# Patient Record
Sex: Male | Born: 1965 | Race: White | Hispanic: No | Marital: Single | Smoking: Never smoker
Health system: Southern US, Community
[De-identification: ages and names within clinical notes are randomized; demographics above are authoritative.]

## PROBLEM LIST (undated history)

## (undated) DIAGNOSIS — E7800 Pure hypercholesterolemia, unspecified: Secondary | ICD-10-CM

## (undated) DIAGNOSIS — K219 Gastro-esophageal reflux disease without esophagitis: Secondary | ICD-10-CM

## (undated) DIAGNOSIS — F79 Unspecified intellectual disabilities: Secondary | ICD-10-CM

## (undated) DIAGNOSIS — N200 Calculus of kidney: Secondary | ICD-10-CM

## (undated) HISTORY — DX: Pure hypercholesterolemia, unspecified: E78.00

## (undated) HISTORY — DX: Calculus of kidney: N20.0

## (undated) HISTORY — DX: Gastro-esophageal reflux disease without esophagitis: K21.9

---

## 2008-06-25 ENCOUNTER — Emergency Department (HOSPITAL_COMMUNITY): Admission: EM | Admit: 2008-06-25 | Discharge: 2008-06-25 | Payer: Self-pay | Admitting: Emergency Medicine

## 2020-09-20 ENCOUNTER — Other Ambulatory Visit: Payer: Self-pay | Admitting: Family Medicine

## 2020-09-20 ENCOUNTER — Other Ambulatory Visit (HOSPITAL_COMMUNITY): Payer: Self-pay | Admitting: Family Medicine

## 2020-09-20 DIAGNOSIS — R1314 Dysphagia, pharyngoesophageal phase: Secondary | ICD-10-CM

## 2020-09-23 ENCOUNTER — Other Ambulatory Visit: Payer: Self-pay

## 2020-09-23 ENCOUNTER — Ambulatory Visit (HOSPITAL_COMMUNITY)
Admission: RE | Admit: 2020-09-23 | Discharge: 2020-09-23 | Disposition: A | Discharge status: Home / Self Care | Payer: Medicare Other | Source: Ambulatory Visit | Attending: Family Medicine | Admitting: Family Medicine

## 2020-09-23 ENCOUNTER — Other Ambulatory Visit (HOSPITAL_COMMUNITY): Payer: Self-pay | Admitting: Family Medicine

## 2020-09-23 DIAGNOSIS — R1314 Dysphagia, pharyngoesophageal phase: Secondary | ICD-10-CM | POA: Diagnosis present

## 2021-08-29 IMAGING — RF DG ESOPHAGUS
9 of 11 series · 12 of 24 positions shown · non-contrast
Comparison: None

CLINICAL DATA: Dysphagia, pharyngo esophageal phase, difficulty
swallowing meats and breads, which hang in throat choking patient,
symptoms for a few weeks, no prior endoscopy

EXAM:
ESOPHOGRAM / BARIUM SWALLOW / BARIUM TABLET STUDY
TECHNIQUE: Combined double contrast and single contrast examination performed
using effervescent crystals, thick barium liquid, and thin barium
liquid. The patient was observed with fluoroscopy swallowing a 13 mm
barium sulphate tablet.
FLUOROSCOPY TIME:  Fluoroscopy Time:  1 minutes 42 seconds
Radiation Exposure Index (if provided by the fluoroscopic device):
45.9 mGy
Number of Acquired Spot Images: multiple fluoroscopic screen
captures

[Series 1: cp_standard · 0.17mm/px · 1 of 61 frames shown (1 of 9)]
[frame 20/61]
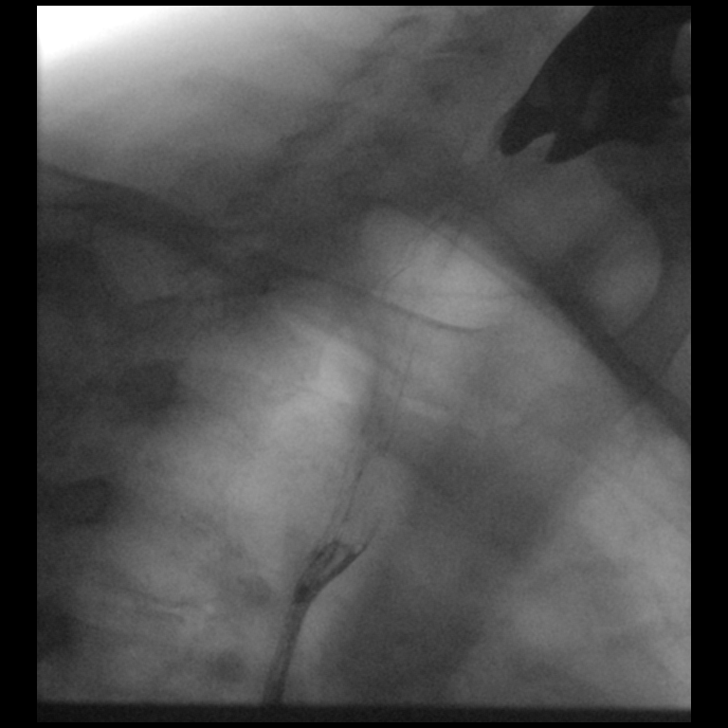

[Series 2: cp_standard · 0.17mm/px · 2 of 52 frames shown (2 of 9)]
[frame 8/52]
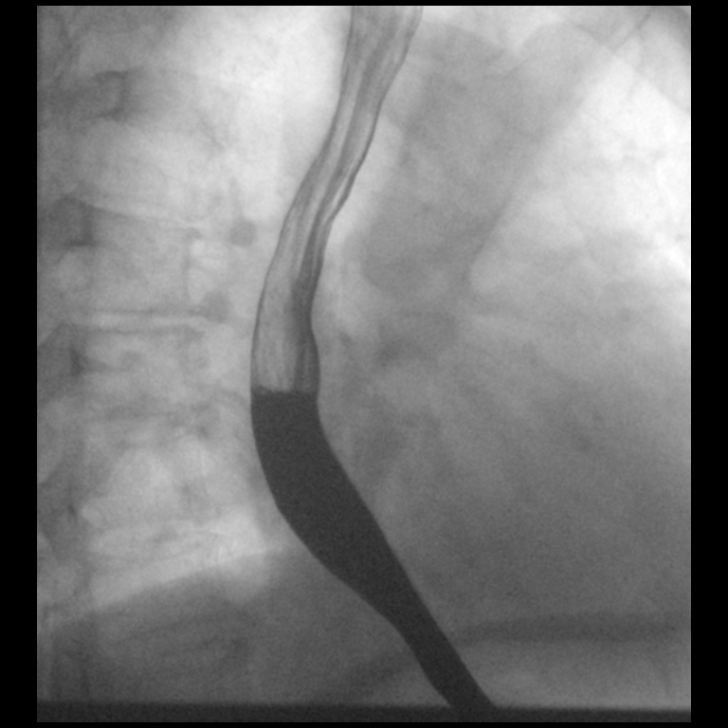
[frame 50/52]
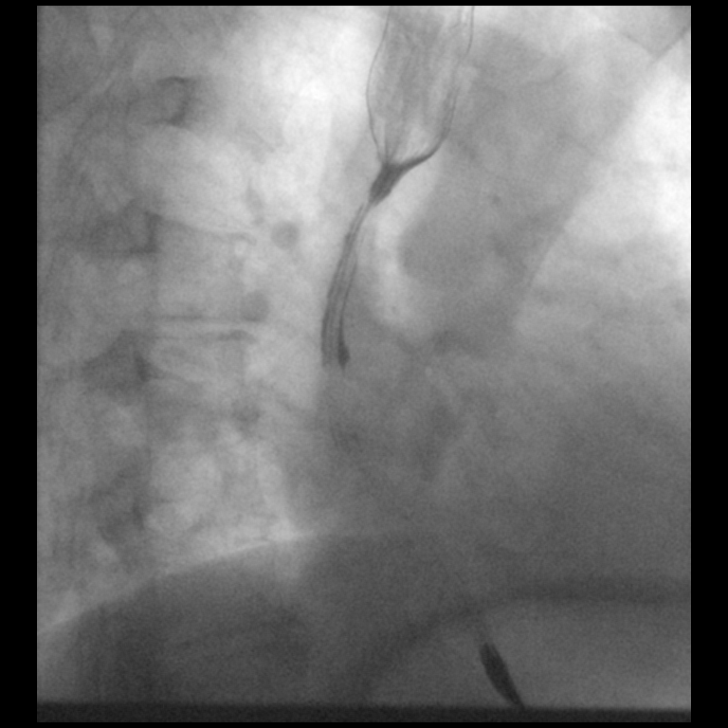

[Series 3: cp_standard · 0.17mm/px · 1 of 39 frames shown (3 of 9)]
[frame 20/39]
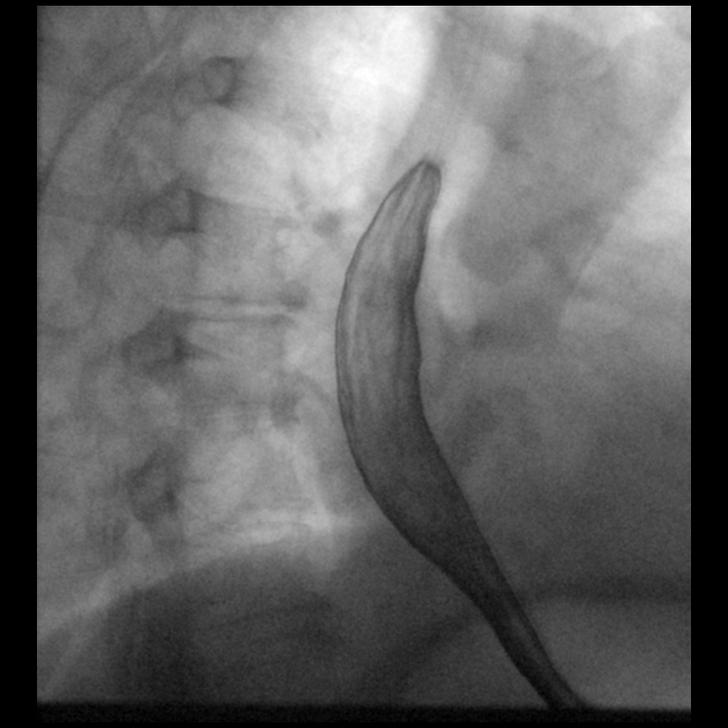

[Series 4: cp_standard · 0.17mm/px · 1 of 78 frames shown (4 of 9)]
[frame 40/78]
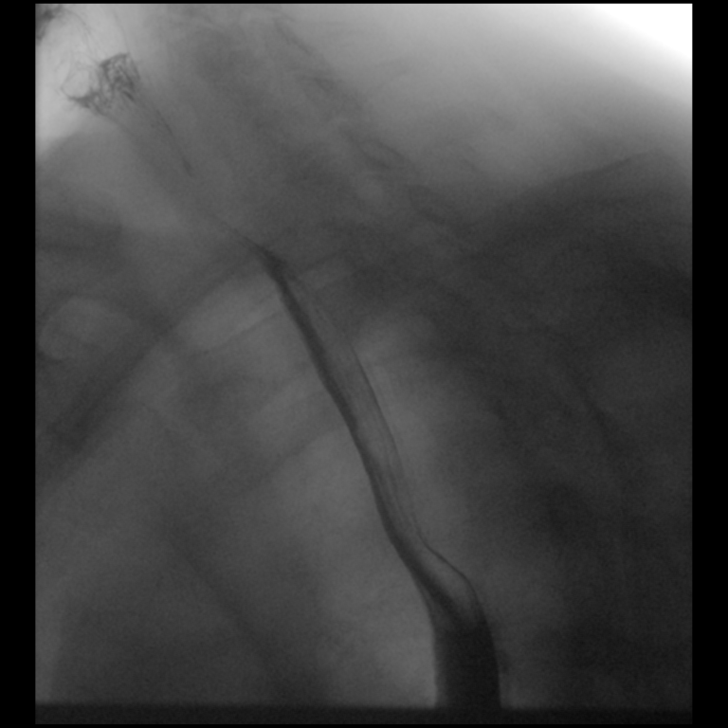

[Series 5: cp_standard · 0.17mm/px · 2 of 168 frames shown (5 of 9)]
[frame 22/168]
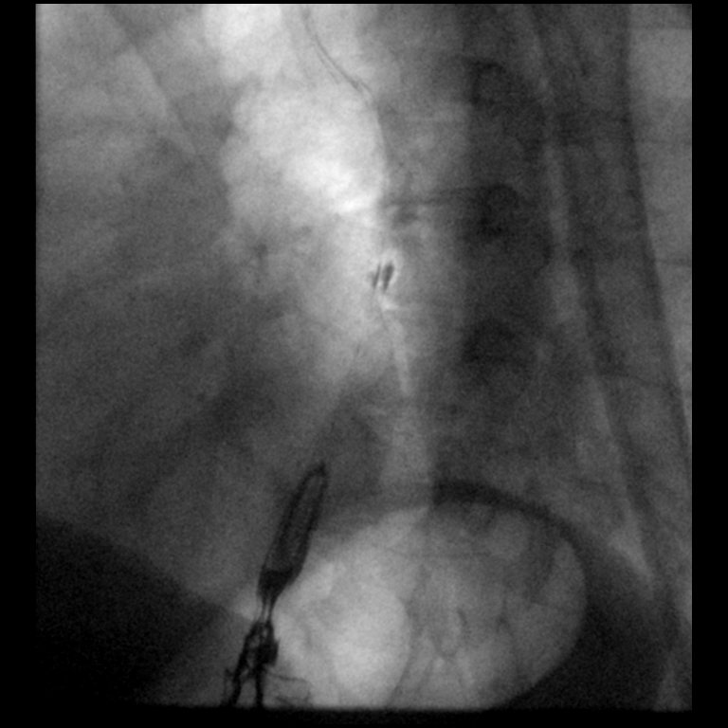
[frame 143/168]
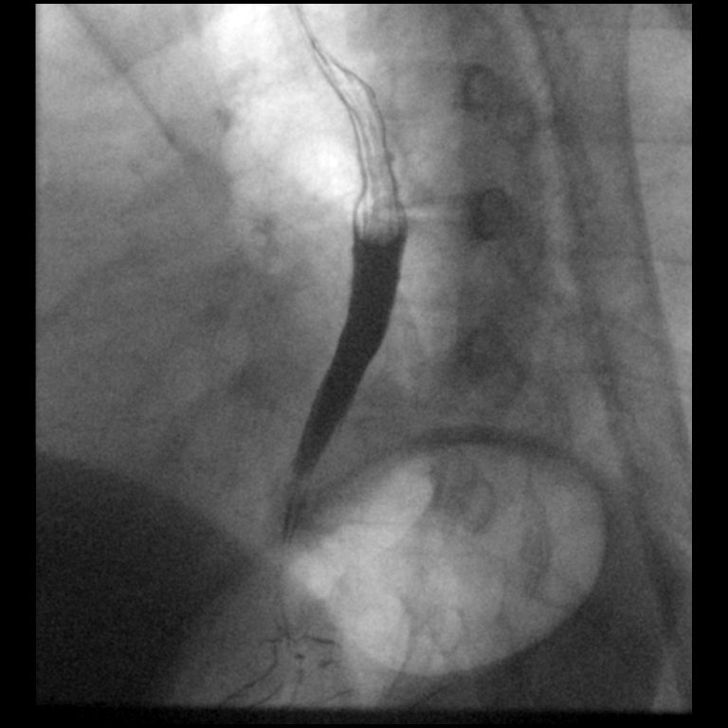

[Series 6: cp_standard · 0.27mm/px · 1 of 111 frames shown (6 of 9)]
[frame 56/111]
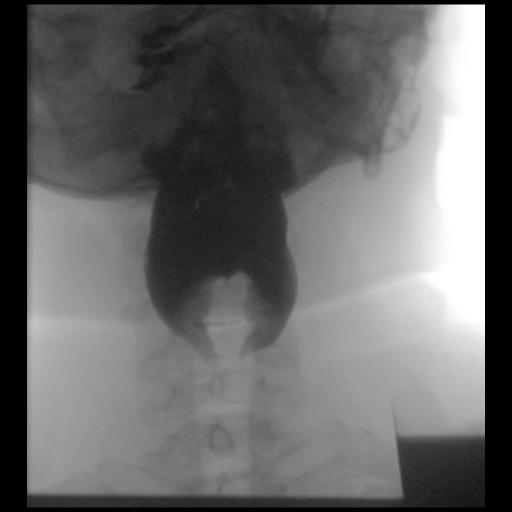

[Series 8: cp_standard · 0.25mm/px · 2 of 93 frames shown (7 of 9)]
[frame 12/93]
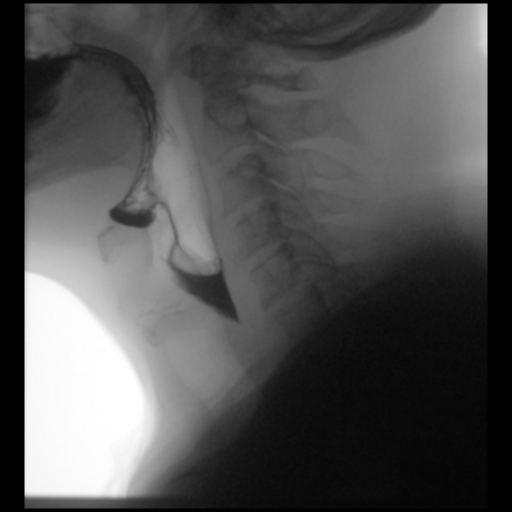
[frame 80/93]
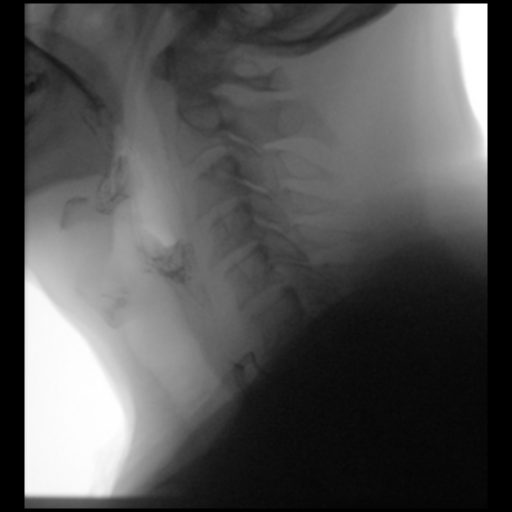

[Series 9: cp_standard · 0.25mm/px · 1 of 184 frames shown (8 of 9)]
[frame 157/184]
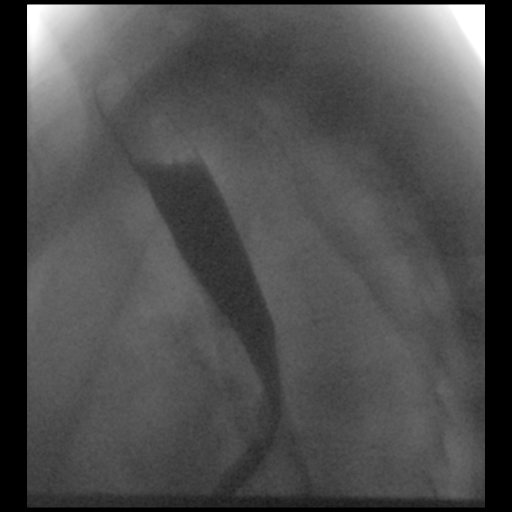

[Series 11: cp_standard · 0.18mm/px · 1 of 1 slices shown (9 of 9)]
[im 1/1]
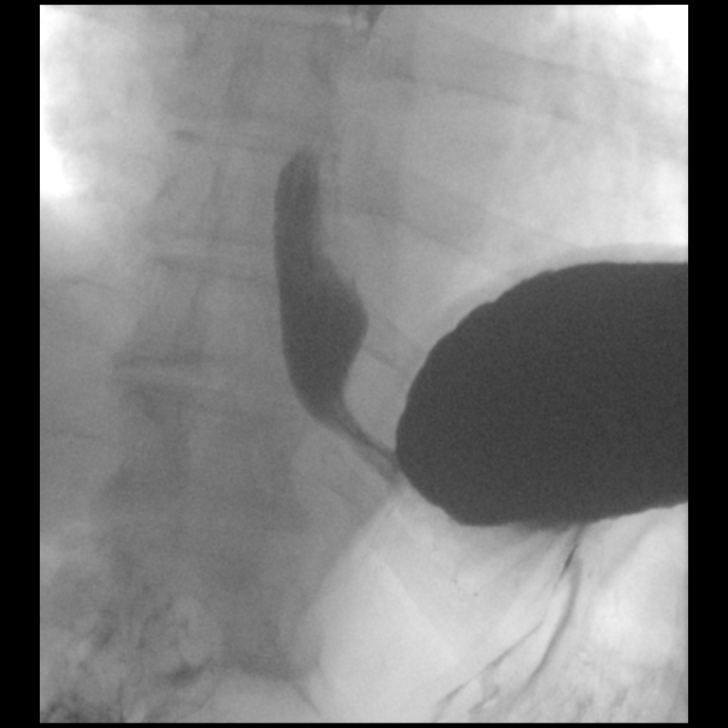

[12 of 24 positions shown; findings below may reference images not displayed]

FINDINGS: Esophageal distention: Normal distention without mass or stricture

Filling defects:  None

12.5 mm barium tablet: Easily passed from oral cavity to stomach
without obstruction

Motility:  Normal

Mucosa:  Smooth without irregularity or ulceration

Hypopharynx/cervical esophagus: Laryngeal penetration with contrast
extending to the vocal cords. No definite aspiration into the
proximal trachea. Significant vallecular and piriform sinus
residuals. No Zenker diverticulum.

Hiatal hernia:  Absent

GE reflux:  Not witnessed during exam

Other:  N/A
IMPRESSION: Laryngeal penetration with contrast extending to the vocal cords
without a aspiration into proximal trachea.

Significant vallecular and piriform sinus residuals.

No esophageal mass or stricture.

## 2021-12-28 ENCOUNTER — Encounter (HOSPITAL_COMMUNITY)
Admission: RE | Admit: 2021-12-28 | Discharge: 2021-12-28 | Disposition: A | Discharge status: Home / Self Care | Payer: Medicare Other | Source: Ambulatory Visit | Attending: Ophthalmology | Admitting: Ophthalmology

## 2022-01-01 NOTE — H&P (Signed)
Surgical History & Physical ? ?Patient Name: Nathaniel Huff DOB: December 01, 1965 ? ?Surgery: Cataract extraction with intraocular lens implant phacoemulsification; Left Eye ? ?Surgeon: Fabio Pierce MD ?Surgery Date:  01-05-22 ?Pre-Op Date:  12-28-21 ? ?HPI: ?A 69 Yr. old male patient The patient is here for a cataract evaluation of both eyes, referred from Dr. Daphine Deutscher. Since the last visit, the affected area is worsening. The patient's vision is blurry. The complaint is associated with difficulty reading small print on medicine bottles/labels, difficulty reading captions on a tv, and difficulty recognizing people at a distance. Patient is not taking medications. This is negatively affecting the patient's quality of life and the patient is unable to function adequately in life with the current level of vision. HPI Completed by Dr. Fabio Pierce ? ?Medical History: ?Dry Eyes ?Trichiasis of right eyelid ?Macula Degeneration ?Glaucoma ?Gastroesophageal reflux disease Hypercholesterol... ? ?Review of Systems ?Negative Allergic/Immunologic ?Negative Cardiovascular ?Negative Constitutional ?Negative Ear, Nose, Mouth & Throat ?Negative Endocrine ?Negative Eyes ?Negative Gastrointestinal ?Negative Genitourinary ?Negative Hemotologic/Lymphatic ?Negative Integumentary ?Negative Musculoskeletal ?Negative Neurological ?Negative Psychiatry ?Negative Respiratory ? ?Social ?  Former smoker  ? ?Medication ?Atorvastatin, Omeprazole,  ? ?Sx/Procedures ? None ? ?Drug Allergies  ? NKDA ? ?History & Physical: ?Heent: Cataract, Left Eye ?NECK: supple without bruits ?LUNGS: lungs clear to auscultation ?CV: regular rate and rhythm ?Abdomen: soft and non-tender ?Impression & Plan: ?Assessment: ?1.  COMBINED FORMS AGE RELATED CATARACT; Left Eye [phone]) ?2.  Trichiasis; Left Lower Lid (H02.015) ?3.  NUCLEAR SCLEROSIS AGE RELATED; Right Eye (H25.11) ?4.  ASTIGMATISM, REGULAR; Both Eyes (H52.223) ? ?Plan: 1.  Cataract accounts for the patient's  decreased vision. This visual impairment is not correctable with a tolerable change in glasses or contact lenses. Cataract surgery with an implantation of a new lens should significantly improve the visual and functional status of the patient. Discussed all risks, benefits, alternatives, and potential complications. Discussed the procedures and recovery. Patient desires to have surgery. A-scan ordered and performed today for intra-ocular lens calculations. The surgery will be performed in order to improve vision for driving, reading, and for eye examinations. Recommend phacoemulsification with intra-ocular lens. Recommend Dextenza for post-operative pain and inflammation. ?Left Eye worse - first. ?Dilates well - shugarcaine by protocol. ?Toric Lens. ? ?2.  Asymptomatic, observe. ? ?3.  Will address after left eye. ? ?4.  recommend toric IOL OU. ?

## 2022-01-01 NOTE — Pre-Procedure Instructions (Signed)
Attempted for the second time to call patient for preop phone call. The phone states "call cannot be completed at this time. Please call back later.". ?

## 2022-01-03 ENCOUNTER — Encounter (HOSPITAL_COMMUNITY): Payer: Self-pay | Admitting: Ophthalmology

## 2022-01-05 ENCOUNTER — Encounter (HOSPITAL_COMMUNITY)
Admission: RE | Disposition: A | Discharge status: Home / Self Care | Payer: Self-pay | Source: Ambulatory Visit | Attending: Ophthalmology

## 2022-01-05 ENCOUNTER — Ambulatory Visit (HOSPITAL_BASED_OUTPATIENT_CLINIC_OR_DEPARTMENT_OTHER): Payer: Medicare Other | Admitting: Anesthesiology

## 2022-01-05 ENCOUNTER — Ambulatory Visit (HOSPITAL_COMMUNITY): Payer: Medicare Other | Admitting: Anesthesiology

## 2022-01-05 ENCOUNTER — Other Ambulatory Visit: Payer: Self-pay

## 2022-01-05 ENCOUNTER — Ambulatory Visit (HOSPITAL_COMMUNITY)
Admission: RE | Admit: 2022-01-05 | Discharge: 2022-01-05 | Disposition: A | Discharge status: Home / Self Care | Payer: Medicare Other | Source: Ambulatory Visit | Attending: Ophthalmology | Admitting: Ophthalmology

## 2022-01-05 ENCOUNTER — Encounter (HOSPITAL_COMMUNITY): Payer: Self-pay | Admitting: Ophthalmology

## 2022-01-05 DIAGNOSIS — H52223 Regular astigmatism, bilateral: Secondary | ICD-10-CM | POA: Diagnosis not present

## 2022-01-05 DIAGNOSIS — H02055 Trichiasis without entropian left lower eyelid: Secondary | ICD-10-CM | POA: Insufficient documentation

## 2022-01-05 DIAGNOSIS — H25812 Combined forms of age-related cataract, left eye: Secondary | ICD-10-CM | POA: Diagnosis present

## 2022-01-05 DIAGNOSIS — H2511 Age-related nuclear cataract, right eye: Secondary | ICD-10-CM | POA: Diagnosis not present

## 2022-01-05 HISTORY — DX: Unspecified intellectual disabilities: F79

## 2022-01-05 HISTORY — PX: CATARACT EXTRACTION W/PHACO: SHX586

## 2022-01-05 SURGERY — PHACOEMULSIFICATION, CATARACT, WITH IOL INSERTION
Anesthesia: Monitor Anesthesia Care | Site: Eye | Laterality: Left

## 2022-01-05 MED ORDER — LIDOCAINE HCL (PF) 1 % IJ SOLN
INTRAOCULAR | Status: DC | PRN
  Administered 2022-01-05: 1 mL

## 2022-01-05 MED ORDER — STERILE WATER FOR IRRIGATION IR SOLN
Status: DC | PRN
  Administered 2022-01-05: 250 mL

## 2022-01-05 MED ORDER — SODIUM HYALURONATE 10 MG/ML IO SOLUTION
PREFILLED_SYRINGE | INTRAOCULAR | Status: DC | PRN
  Administered 2022-01-05: 0.85 mL via INTRAOCULAR

## 2022-01-05 MED ORDER — PHENYLEPHRINE HCL 2.5 % OP SOLN
1.0000 [drp] | OPHTHALMIC | Status: AC | PRN
  Administered 2022-01-05 (×3): 1 [drp] via OPHTHALMIC

## 2022-01-05 MED ORDER — EPINEPHRINE PF 1 MG/ML IJ SOLN
INTRAMUSCULAR | Status: AC
  Filled 2022-01-05: qty 2

## 2022-01-05 MED ORDER — LIDOCAINE HCL 3.5 % OP GEL
1.0000 "application " | Freq: Once | OPHTHALMIC | Status: AC
  Administered 2022-01-05: 1 via OPHTHALMIC

## 2022-01-05 MED ORDER — PHENYLEPHRINE-KETOROLAC 1-0.3 % IO SOLN
INTRAOCULAR | Status: AC
  Filled 2022-01-05: qty 4

## 2022-01-05 MED ORDER — SODIUM HYALURONATE 23MG/ML IO SOSY
PREFILLED_SYRINGE | INTRAOCULAR | Status: DC | PRN
  Administered 2022-01-05: 0.6 mL via INTRAOCULAR

## 2022-01-05 MED ORDER — TROPICAMIDE 1 % OP SOLN
1.0000 [drp] | OPHTHALMIC | Status: AC | PRN
  Administered 2022-01-05 (×3): 1 [drp] via OPHTHALMIC

## 2022-01-05 MED ORDER — POVIDONE-IODINE 5 % OP SOLN
OPHTHALMIC | Status: DC | PRN
Start: 2022-01-05 — End: 2022-01-05
  Administered 2022-01-05: 1 via OPHTHALMIC

## 2022-01-05 MED ORDER — TETRACAINE HCL 0.5 % OP SOLN
1.0000 [drp] | OPHTHALMIC | Status: AC | PRN
  Administered 2022-01-05 (×3): 1 [drp] via OPHTHALMIC

## 2022-01-05 MED ORDER — EPINEPHRINE PF 1 MG/ML IJ SOLN
INTRAOCULAR | Status: DC | PRN
  Administered 2022-01-05: 500 mL

## 2022-01-05 MED ORDER — NEOMYCIN-POLYMYXIN-DEXAMETH 3.5-10000-0.1 OP SUSP
OPHTHALMIC | Status: DC | PRN
  Administered 2022-01-05: 2 [drp] via OPHTHALMIC

## 2022-01-05 MED ORDER — BSS IO SOLN
INTRAOCULAR | Status: DC | PRN
  Administered 2022-01-05: 15 mL via INTRAOCULAR

## 2022-01-05 SURGICAL SUPPLY — 14 items
CATARACT SUITE SIGHTPATH (MISCELLANEOUS) ×2 IMPLANT
CLOTH BEACON ORANGE TIMEOUT ST (SAFETY) ×2 IMPLANT
EYE SHIELD UNIVERSAL CLEAR (GAUZE/BANDAGES/DRESSINGS) ×1 IMPLANT
FEE CATARACT SUITE SIGHTPATH (MISCELLANEOUS) ×1 IMPLANT
GLOVE SURG UNDER POLY LF SZ7 (GLOVE) ×2 IMPLANT
LENS IOL RAYNER 21.0 (Intraocular Lens) ×2 IMPLANT
LENS IOL RAYONE EMV 21.0 (Intraocular Lens) IMPLANT
NDL HYPO 18GX1.5 BLUNT FILL (NEEDLE) ×1 IMPLANT
NEEDLE HYPO 18GX1.5 BLUNT FILL (NEEDLE) ×2 IMPLANT
PAD ARMBOARD 7.5X6 YLW CONV (MISCELLANEOUS) ×2 IMPLANT
RING MALYGIN 7.0 (MISCELLANEOUS) ×1 IMPLANT
SYR TB 1ML LL NO SAFETY (SYRINGE) ×2 IMPLANT
TAPE PAPER 1X10 WHT MICROPORE (GAUZE/BANDAGES/DRESSINGS) ×1 IMPLANT
WATER STERILE IRR 250ML POUR (IV SOLUTION) ×2 IMPLANT

## 2022-01-05 NOTE — Transfer of Care (Signed)
Immediate Anesthesia Transfer of Care Note ? ?Patient: BRAYCEN BURANDT ? ?Procedure(s) Performed: CATARACT EXTRACTION PHACO AND INTRAOCULAR LENS PLACEMENT (IOC) (Left: Eye) ? ?Patient Location: Short Stay ? ?Anesthesia Type:MAC ? ?Level of Consciousness: awake and patient cooperative ? ?Airway & Oxygen Therapy: Patient Spontanous Breathing ? ?Post-op Assessment: Report given to RN and Post -op Vital signs reviewed and stable ? ?Post vital signs: Reviewed and stable ? ?Last Vitals:  ?Vitals Value Taken Time  ?BP 162/95 1155  ?Temp 98.1 1155  ?Pulse 67 1155  ?Resp 20 1155  ?SpO2 98 1155  ? ? ?Last Pain:  ?Vitals:  ? 01/05/22 1037  ?TempSrc: Oral  ?PainSc:   ?   ? ?Patients Stated Pain Goal: 5 (01/05/22 1047) ? ?Complications: No notable events documented. ?

## 2022-01-05 NOTE — Op Note (Signed)
Date of procedure: 01/05/22 ? ?Pre-operative diagnosis: Visually significant age-related combined cataract, Left Eye (H25.812) ? ?Post-operative diagnosis: Visually significant age-related combined cataract, Left Eye (H25.812) ? ?Procedure: Removal of cataract via phacoemulsification and insertion of intra-ocular lens Rayner RAO200E +21.0D into the capsular bag of the Left Eye ? ?Attending surgeon: Gerda Diss. Marisa Hua, MD, MA ? ?Anesthesia: MAC, Topical Akten ? ?Complications: None ? ?Estimated Blood Loss: <38m (minimal) ? ?Specimens: None ? ?Implants: As above ? ?Indications:  Visually significant age-related cataract, Left Eye ? ?Procedure:  ?The patient was seen and identified in the pre-operative area. The operative eye was identified and dilated.  The operative eye was marked.  Topical anesthesia was administered to the operative eye.    ? ?The patient was then to the operative suite and placed in the supine position.  A timeout was performed confirming the patient, procedure to be performed, and all other relevant information.   The patient's face was prepped and draped in the usual fashion for intra-ocular surgery.  A lid speculum was placed into the operative eye and the surgical microscope moved into place and focused.  An inferotemporal paracentesis was created using a 20 gauge paracentesis blade.  Shugarcaine was injected into the anterior chamber.  Viscoelastic was injected into the anterior chamber.  A temporal clear-corneal main wound incision was created using a 2.480mmicrokeratome.  A continuous curvilinear capsulorrhexis was initiated using an irrigating cystitome and completed using capsulorrhexis forceps.  Hydrodissection and hydrodeliniation were performed.  Viscoelastic was injected into the anterior chamber.  A phacoemulsification handpiece and a chopper as a second instrument were used to remove the nucleus and epinucleus. The irrigation/aspiration handpiece was used to remove any remaining  cortical material.  ? ?The capsular bag was reinflated with viscoelastic, checked, and found to be intact.  The intraocular lens was inserted into the capsular bag.  The irrigation/aspiration handpiece was used to remove any remaining viscoelastic.  The clear corneal wound and paracentesis wounds were then hydrated and checked with Weck-Cels to be watertight.  Maxitrol was instilled in the eye. The lid-speculum was removed.  The drape was removed.  The patient's face was cleaned with a wet and dry 4x4.    A clear shield was taped over the eye. The patient was taken to the post-operative care unit in good condition, having tolerated the procedure well. ? ?Post-Op Instructions: [facility] and will receive all other orders and instructions. ? ?

## 2022-01-05 NOTE — Interval H&P Note (Signed)
History and Physical Interval Note: ? ?01/05/2022 ?11:28 AM ? ?Nathaniel Huff  has presented today for surgery, with the diagnosis of combined forms age related cataract, left.  The various methods of treatment have been discussed with the patient and family. After consideration of risks, benefits and other options for treatment, the patient has consented to  Procedure(s) with comments: ?CATARACT EXTRACTION PHACO AND INTRAOCULAR LENS PLACEMENT (IOC) (Left) - ODE:  as a surgical intervention.  The patient's history has been reviewed, patient examined, no change in status, stable for surgery.  I have reviewed the patient's chart and labs.  Questions were answered to the patient's satisfaction.   ? ? ?Fabio Pierce ? ? ?

## 2022-01-05 NOTE — Discharge Instructions (Addendum)
Please discharge patient when stable, will follow up today with Dr. Wrzosek at the [facility] [place] office immediately following discharge.  Leave shield in place until visit.  All paperwork with discharge instructions will be given at the office.  [facility] [place] Address:  [address]  [place], [place] [address]  

## 2022-01-05 NOTE — Anesthesia Preprocedure Evaluation (Signed)
Anesthesia Evaluation  ?Patient identified by MRN, date of birth, ID band ?Patient awake ? ? ? ?Reviewed: ?Allergy & Precautions, H&P , NPO status , Patient's Chart, lab work & pertinent test results, reviewed documented beta blocker date and time  ? ?Airway ?Mallampati: II ? ?TM Distance: >3 FB ?Neck ROM: full ? ? ? Dental ?no notable dental hx. ? ?  ?Pulmonary ?neg pulmonary ROS,  ?  ?Pulmonary exam normal ?breath sounds clear to auscultation ? ? ? ? ? ? Cardiovascular ?Exercise Tolerance: Good ?negative cardio ROS ? ? ?Rhythm:regular Rate:Normal ? ? ?  ?Neuro/Psych ?negative neurological ROS ? negative psych ROS  ? GI/Hepatic ?negative GI ROS, Neg liver ROS,   ?Endo/Other  ?negative endocrine ROS ? Renal/GU ?negative Renal ROS  ?negative genitourinary ?  ?Musculoskeletal ? ? Abdominal ?  ?Peds ? Hematology ?negative hematology ROS ?(+)   ?Anesthesia Other Findings ? ? Reproductive/Obstetrics ?negative OB ROS ? ?  ? ? ? ? ? ? ? ? ? ? ? ? ? ?  ?  ? ? ? ? ? ? ? ? ?Anesthesia Physical ?Anesthesia Plan ? ?ASA: 2 ? ?Anesthesia Plan: MAC  ? ?Post-op Pain Management:   ? ?Induction:  ? ?PONV Risk Score and Plan:  ? ?Airway Management Planned:  ? ?Additional Equipment:  ? ?Intra-op Plan:  ? ?Post-operative Plan:  ? ?Informed Consent: I have reviewed the patients History and Physical, chart, labs and discussed the procedure including the risks, benefits and alternatives for the proposed anesthesia with the patient or authorized representative who has indicated his/her understanding and acceptance.  ? ? ? ?Dental Advisory Given ? ?Plan Discussed with: CRNA ? ?Anesthesia Plan Comments:   ? ? ? ? ? ? ?Anesthesia Quick Evaluation ? ?

## 2022-01-05 NOTE — Anesthesia Postprocedure Evaluation (Signed)
Anesthesia Post Note ? ?Patient: Nathaniel Huff ? ?Procedure(s) Performed: CATARACT EXTRACTION PHACO AND INTRAOCULAR LENS PLACEMENT (IOC) (Left: Eye) ? ?Patient location during evaluation: Phase II ?Anesthesia Type: MAC ?Level of consciousness: awake ?Pain management: pain level controlled ?Vital Signs Assessment: post-procedure vital signs reviewed and stable ?Respiratory status: spontaneous breathing and respiratory function stable ?Cardiovascular status: blood pressure returned to baseline and stable ?Postop Assessment: no headache and no apparent nausea or vomiting ?Anesthetic complications: no ?Comments: Late entry ? ? ?No notable events documented. ? ? ?Last Vitals:  ?Vitals:  ? 01/05/22 1037 01/05/22 1155  ?BP: (!) 139/95 (!) 162/95  ?Pulse: 66 63  ?Resp: 17 18  ?Temp: 36.5 ?C 36.7 ?C  ?SpO2: 99% 96%  ?  ?Last Pain:  ?Vitals:  ? 01/05/22 1155  ?TempSrc: Oral  ?PainSc: 0-No pain  ? ? ?  ?  ?  ?  ?  ?  ? ?Louann Sjogren ? ? ? ? ?

## 2022-01-05 NOTE — Anesthesia Procedure Notes (Signed)
Date/Time: 01/05/2022 11:34 AM ?Performed by: Franco Nones, CRNA ?Pre-anesthesia Checklist: Patient identified, Emergency Drugs available, Suction available, Timeout performed and Patient being monitored ?Patient Re-evaluated:Patient Re-evaluated prior to induction ?Oxygen Delivery Method: Nasal Cannula ? ? ? ? ?

## 2022-01-08 ENCOUNTER — Encounter (HOSPITAL_COMMUNITY): Payer: Self-pay | Admitting: Ophthalmology

## 2022-01-10 ENCOUNTER — Encounter (HOSPITAL_COMMUNITY)
Admission: RE | Admit: 2022-01-10 | Discharge: 2022-01-10 | Disposition: A | Discharge status: Home / Self Care | Payer: Medicare Other | Source: Ambulatory Visit | Attending: Ophthalmology | Admitting: Ophthalmology

## 2022-01-10 NOTE — H&P (Signed)
Surgical History & Physical ? ?Patient Name: Nathaniel Huff DOB: Apr 24, 1966 ? ?Surgery: Cataract extraction with intraocular lens implant phacoemulsification; Right Eye ? ?Surgeon: Baruch Goldmann MD ?Surgery Date:  01-15-22 ?Pre-Op Date:  01-08-22 ? ?HPI: ?A 70 Yr. old male patient 1. The patient is returning after cataract post-op. The left eye is affected. Status post cataract post-op, 1 week ago: Since the last visit, the affected area is doing well. The patient's vision is improved. Patient is following medication instructions. The patient's right vision is blurry. The complaint is associated with difficulty reading small print on medicine bottles/labels, difficulty reading captions on a tv, and difficulty recognizing people at a distance. This is negatively affecting the patient's quality of life and the patient is unable to function adequately in life with the current level of vision. HPI was performed by Baruch Goldmann . ? ?Medical History: ?Dry Eyes ?Trichiasis of right eyelid ?Macula Degeneration ?Glaucoma ?Gastroesophageal reflux disease Hypercholesterol... ? ?Review of Systems ?Negative Allergic/Immunologic ?Negative Cardiovascular ?Negative Constitutional ?Negative Ear, Nose, Mouth & Throat ?Negative Endocrine ?Negative Eyes ?Negative Gastrointestinal ?Negative Genitourinary ?Negative Hemotologic/Lymphatic ?Negative Integumentary ?Negative Musculoskeletal ?Negative Neurological ?Negative Psychiatry ?Negative Respiratory ? ?Social ?  Former smoker  ? ?Medication ?Prednisolone-Moxifloxacin-Bromfenac,  ?Atorvastatin, Omeprazole,  ? ?Sx/Procedures ?Phaco c IOL OS,  ? ?Drug Allergies  ? NKDA ? ?History & Physical: ?Heent: Cataract, Right Eye ?NECK: supple without bruits ?LUNGS: lungs clear to auscultation ?CV: regular rate and rhythm ?Abdomen: soft and non-tender ? ?Impression & Plan: ?Assessment: ?1.  CATARACT EXTRACTION STATUS; Left Eye [phone]) ?2.  NUCLEAR SCLEROSIS AGE RELATED; Right Eye (H25.11) ? ?Plan: 1.   1 week after cataract surgery. Doing well with improved vision and normal eye pressure. Call with any problems or concerns. ?Continue Pred-Moxi-Brom 2x/day for 3.5 more weeks. ? ?2.  Cataract accounts for the patient's decreased vision. This visual impairment is not correctable with a tolerable change in glasses or contact lenses. Cataract surgery with an implantation of a new lens should significantly improve the visual and functional status of the patient. Discussed all risks, benefits, alternatives, and potential complications. Discussed the procedures and recovery. Patient desires to have surgery. A-scan ordered and performed today for intra-ocular lens calculations. The surgery will be performed in order to improve vision for driving, reading, and for eye examinations. Recommend phacoemulsification with intra-ocular lens. Recommend Dextenza for post-operative pain and inflammation. ?Right Eye. ?Surgery required to correct imbalance of vision. ?Dilates well - shugarcaine by protocol. ?

## 2022-01-15 ENCOUNTER — Ambulatory Visit (HOSPITAL_BASED_OUTPATIENT_CLINIC_OR_DEPARTMENT_OTHER): Payer: Medicare Other | Admitting: Anesthesiology

## 2022-01-15 ENCOUNTER — Encounter (HOSPITAL_COMMUNITY)
Admission: RE | Disposition: A | Discharge status: Home / Self Care | Payer: Self-pay | Source: Home / Self Care | Attending: Ophthalmology

## 2022-01-15 ENCOUNTER — Ambulatory Visit (HOSPITAL_COMMUNITY)
Admission: RE | Admit: 2022-01-15 | Discharge: 2022-01-15 | Disposition: A | Discharge status: Home / Self Care | Payer: Medicare Other | Attending: Ophthalmology | Admitting: Ophthalmology

## 2022-01-15 ENCOUNTER — Ambulatory Visit (HOSPITAL_COMMUNITY): Payer: Medicare Other | Admitting: Anesthesiology

## 2022-01-15 DIAGNOSIS — H2511 Age-related nuclear cataract, right eye: Secondary | ICD-10-CM | POA: Diagnosis present

## 2022-01-15 DIAGNOSIS — Z9842 Cataract extraction status, left eye: Secondary | ICD-10-CM | POA: Diagnosis not present

## 2022-01-15 HISTORY — PX: CATARACT EXTRACTION W/PHACO: SHX586

## 2022-01-15 SURGERY — PHACOEMULSIFICATION, CATARACT, WITH IOL INSERTION
Anesthesia: Monitor Anesthesia Care | Site: Eye | Laterality: Right

## 2022-01-15 MED ORDER — TETRACAINE HCL 0.5 % OP SOLN
1.0000 [drp] | OPHTHALMIC | Status: AC | PRN
  Administered 2022-01-15 (×3): 1 [drp] via OPHTHALMIC

## 2022-01-15 MED ORDER — BSS IO SOLN
INTRAOCULAR | Status: DC | PRN
  Administered 2022-01-15: 15 mL via INTRAOCULAR

## 2022-01-15 MED ORDER — EPINEPHRINE PF 1 MG/ML IJ SOLN
INTRAMUSCULAR | Status: AC
  Filled 2022-01-15: qty 2

## 2022-01-15 MED ORDER — SODIUM HYALURONATE 23MG/ML IO SOSY
PREFILLED_SYRINGE | INTRAOCULAR | Status: DC | PRN
Start: 2022-01-15 — End: 2022-01-15
  Administered 2022-01-15: 0.6 mL via INTRAOCULAR

## 2022-01-15 MED ORDER — STERILE WATER FOR IRRIGATION IR SOLN
Status: DC | PRN
  Administered 2022-01-15: 250 mL

## 2022-01-15 MED ORDER — LIDOCAINE HCL 3.5 % OP GEL
1.0000 "application " | Freq: Once | OPHTHALMIC | Status: AC
  Administered 2022-01-15: 1 via OPHTHALMIC

## 2022-01-15 MED ORDER — POVIDONE-IODINE 5 % OP SOLN
OPHTHALMIC | Status: DC | PRN
  Administered 2022-01-15: 1 via OPHTHALMIC

## 2022-01-15 MED ORDER — EPINEPHRINE PF 1 MG/ML IJ SOLN
INTRAOCULAR | Status: DC | PRN
  Administered 2022-01-15: 500 mL

## 2022-01-15 MED ORDER — LIDOCAINE HCL (PF) 1 % IJ SOLN
INTRAOCULAR | Status: DC | PRN
  Administered 2022-01-15: 1 mL via OPHTHALMIC

## 2022-01-15 MED ORDER — NEOMYCIN-POLYMYXIN-DEXAMETH 3.5-10000-0.1 OP SUSP
OPHTHALMIC | Status: DC | PRN
  Administered 2022-01-15: 1 [drp] via OPHTHALMIC

## 2022-01-15 MED ORDER — SODIUM HYALURONATE 10 MG/ML IO SOLUTION
PREFILLED_SYRINGE | INTRAOCULAR | Status: DC | PRN
  Administered 2022-01-15: 0.85 mL via INTRAOCULAR

## 2022-01-15 MED ORDER — TROPICAMIDE 1 % OP SOLN
1.0000 [drp] | OPHTHALMIC | Status: AC | PRN
  Administered 2022-01-15 (×3): 1 [drp] via OPHTHALMIC

## 2022-01-15 MED ORDER — PHENYLEPHRINE HCL 2.5 % OP SOLN
1.0000 [drp] | OPHTHALMIC | Status: AC | PRN
  Administered 2022-01-15 (×3): 1 [drp] via OPHTHALMIC

## 2022-01-15 SURGICAL SUPPLY — 16 items
CATARACT SUITE SIGHTPATH (MISCELLANEOUS) ×2 IMPLANT
CLOTH BEACON ORANGE TIMEOUT ST (SAFETY) ×2 IMPLANT
EYE SHIELD UNIVERSAL CLEAR (GAUZE/BANDAGES/DRESSINGS) ×1 IMPLANT
FEE CATARACT SUITE SIGHTPATH (MISCELLANEOUS) ×1 IMPLANT
GLOVE SURG UNDER POLY LF SZ6.5 (GLOVE) ×1 IMPLANT
GLOVE SURG UNDER POLY LF SZ7 (GLOVE) ×1 IMPLANT
LENS IOL RAYNER 21.0 (Intraocular Lens) ×2 IMPLANT
LENS IOL RAYONE EMV 21.0 (Intraocular Lens) IMPLANT
NDL HYPO 18GX1.5 BLUNT FILL (NEEDLE) ×1 IMPLANT
NEEDLE HYPO 18GX1.5 BLUNT FILL (NEEDLE) ×2 IMPLANT
PAD ARMBOARD 7.5X6 YLW CONV (MISCELLANEOUS) ×2 IMPLANT
RING MALYGIN 7.0 (MISCELLANEOUS) IMPLANT
SYR TB 1ML LL NO SAFETY (SYRINGE) ×2 IMPLANT
TAPE SURG TRANSPORE 1 IN (GAUZE/BANDAGES/DRESSINGS) IMPLANT
TAPE SURGICAL TRANSPORE 1 IN (GAUZE/BANDAGES/DRESSINGS) ×1
WATER STERILE IRR 250ML POUR (IV SOLUTION) ×2 IMPLANT

## 2022-01-15 NOTE — Op Note (Signed)
Date of procedure: 01/15/22 ? ?Pre-operative diagnosis: Visually significant age-related nuclear cataract, Right Eye (H25.11) ? ?Post-operative diagnosis: Visually significant age-related nuclear cataract, Right Eye ? ?Procedure: Removal of cataract via phacoemulsification and insertion of intra-ocular lens Rayner RAO200E +21.0D into the capsular bag of the Right Eye ? ?Attending surgeon: Gerda Diss. Marisa Hua, MD, MA ? ?Anesthesia: MAC, Topical Akten ? ?Complications: None ? ?Estimated Blood Loss: <69m (minimal) ? ?Specimens: None ? ?Implants: As above ? ?Indications:  Visually significant age-related cataract, Right Eye ? ?Procedure:  ?The patient was seen and identified in the pre-operative area. The operative eye was identified and dilated.  The operative eye was marked.  Topical anesthesia was administered to the operative eye.    ? ?The patient was then to the operative suite and placed in the supine position.  A timeout was performed confirming the patient, procedure to be performed, and all other relevant information.   The patient's face was prepped and draped in the usual fashion for intra-ocular surgery.  A lid speculum was placed into the operative eye and the surgical microscope moved into place and focused.  A superotemporal paracentesis was created using a 20 gauge paracentesis blade.  Shugarcaine was injected into the anterior chamber.  Viscoelastic was injected into the anterior chamber.  A temporal clear-corneal main wound incision was created using a 2.436mmicrokeratome.  A continuous curvilinear capsulorrhexis was initiated using an irrigating cystitome and completed using capsulorrhexis forceps.  Hydrodissection and hydrodeliniation were performed.  Viscoelastic was injected into the anterior chamber.  A phacoemulsification handpiece and a chopper as a second instrument were used to remove the nucleus and epinucleus. The irrigation/aspiration handpiece was used to remove any remaining cortical  material.  ? ?The capsular bag was reinflated with viscoelastic, checked, and found to be intact.  The intraocular lens was inserted into the capsular bag.  The irrigation/aspiration handpiece was used to remove any remaining viscoelastic.  The clear corneal wound and paracentesis wounds were then hydrated and checked with Weck-Cels to be watertight.  The lid-speculum and drape was removed, and the patient's face was cleaned with a wet and dry 4x4.  Maxitrol was instilled in the eye. A clear shield was taped over the eye. The patient was taken to the post-operative care unit in good condition, having tolerated the procedure well. ? ?Post-Op Instructions: [facility] and will receive all other orders and instructions. ? ?

## 2022-01-15 NOTE — Discharge Instructions (Signed)
Please discharge patient when stable, will follow up today with Dr. Demetrias Goodbar at the [facility] [place] office immediately following discharge.  Leave shield in place until visit.  All paperwork with discharge instructions will be given at the office.  [facility] Address:  [address]  [place], [place] [address]  

## 2022-01-15 NOTE — Transfer of Care (Signed)
Immediate Anesthesia Transfer of Care Note ? ?Patient: Nathaniel Huff ? ?Procedure(s) Performed: CATARACT EXTRACTION PHACO AND INTRAOCULAR LENS PLACEMENT (IOC) (Right: Eye) ? ?Patient Location: PACU ? ?Anesthesia Type:MAC ? ?Level of Consciousness: awake, alert , oriented and patient cooperative ? ?Airway & Oxygen Therapy: Patient Spontanous Breathing ? ?Post-op Assessment: Report given to RN, Post -op Vital signs reviewed and stable and Patient moving all extremities X 4 ? ?Post vital signs: Reviewed and stable ? ?Last Vitals:  ?Vitals Value Taken Time  ?BP    ?Temp    ?Pulse    ?Resp    ?SpO2    ? ? ?Last Pain:  ?Vitals:  ? 01/15/22 1121  ?TempSrc: Oral  ?PainSc: 0-No pain  ?   ? ?Patients Stated Pain Goal: 5 (01/15/22 1121) ? ?Complications: No notable events documented. ?

## 2022-01-15 NOTE — Anesthesia Preprocedure Evaluation (Signed)
Anesthesia Evaluation  ?Patient identified by MRN, date of birth, ID band ?Patient awake ? ? ? ?Reviewed: ?Allergy & Precautions, NPO status , Patient's Chart, lab work & pertinent test results ? ?History of Anesthesia Complications ?Negative for: history of anesthetic complications ? ?Airway ?Mallampati: III ? ?TM Distance: >3 FB ?Neck ROM: Full ? ? ? Dental ? ?(+) Upper Dentures, Lower Dentures ?  ?Pulmonary ?neg pulmonary ROS,  ?  ?Pulmonary exam normal ?breath sounds clear to auscultation ? ? ? ? ? ? Cardiovascular ?negative cardio ROS ?Normal cardiovascular exam ?Rhythm:Regular Rate:Normal ? ? ?  ?Neuro/Psych ?negative neurological ROS ? negative psych ROS  ? GI/Hepatic ?negative GI ROS, Neg liver ROS,   ?Endo/Other  ?negative endocrine ROS ? Renal/GU ?negative Renal ROS  ?negative genitourinary ?  ?Musculoskeletal ?negative musculoskeletal ROS ?(+)  ? Abdominal ?  ?Peds ?negative pediatric ROS ?(+)  Hematology ?negative hematology ROS ?(+)   ?Anesthesia Other Findings ?Mentally challenged - mild ? Reproductive/Obstetrics ?negative OB ROS ? ?  ? ? ? ? ? ? ? ? ? ? ? ? ? ?  ?  ? ? ? ? ? ? ? ?Anesthesia Physical ?Anesthesia Plan ? ?ASA: 2 ? ?Anesthesia Plan: MAC  ? ?Post-op Pain Management: Minimal or no pain anticipated  ? ?Induction:  ? ?PONV Risk Score and Plan:  ? ?Airway Management Planned: Nasal Cannula and Natural Airway ? ?Additional Equipment:  ? ?Intra-op Plan:  ? ?Post-operative Plan:  ? ?Informed Consent: I have reviewed the patients History and Physical, chart, labs and discussed the procedure including the risks, benefits and alternatives for the proposed anesthesia with the patient or authorized representative who has indicated his/her understanding and acceptance.  ? ? ? ?Dental advisory given ? ?Plan Discussed with: CRNA and Surgeon ? ?Anesthesia Plan Comments:   ? ? ? ? ? ? ?Anesthesia Quick Evaluation ? ?

## 2022-01-15 NOTE — Interval H&P Note (Signed)
History and Physical Interval Note: ? ?01/15/2022 ?11:51 AM ? ?Nathaniel Huff  has presented today for surgery, with the diagnosis of nuclear sclerosis age related cataract; right.  The various methods of treatment have been discussed with the patient and family. After consideration of risks, benefits and other options for treatment, the patient has consented to  Procedure(s) with comments: ?CATARACT EXTRACTION PHACO AND INTRAOCULAR LENS PLACEMENT (IOC) (Right) - right as a surgical intervention.  The patient's history has been reviewed, patient examined, no change in status, stable for surgery.  I have reviewed the patient's chart and labs.  Questions were answered to the patient's satisfaction.   ? ? ?Fabio Pierce ? ? ?

## 2022-01-15 NOTE — Anesthesia Postprocedure Evaluation (Signed)
Anesthesia Post Note ? ?Patient: Nathaniel Huff ? ?Procedure(s) Performed: CATARACT EXTRACTION PHACO AND INTRAOCULAR LENS PLACEMENT (IOC) (Right: Eye) ? ?Patient location during evaluation: Phase II ?Anesthesia Type: MAC ?Level of consciousness: awake and alert and oriented ?Pain management: pain level controlled ?Vital Signs Assessment: post-procedure vital signs reviewed and stable ?Respiratory status: spontaneous breathing, nonlabored ventilation and respiratory function stable ?Cardiovascular status: stable and blood pressure returned to baseline ?Postop Assessment: no apparent nausea or vomiting ?Anesthetic complications: no ? ? ?No notable events documented. ? ? ?Last Vitals:  ?Vitals:  ? 01/15/22 1121 01/15/22 1246  ?BP: 137/81 (!) 136/100  ?Pulse: 63 65  ?Resp: 18 16  ?Temp: 36.5 ?C 36.6 ?C  ?SpO2: 95% 97%  ?  ?Last Pain:  ?Vitals:  ? 01/15/22 1246  ?TempSrc: Oral  ?PainSc: 0-No pain  ? ? ?  ?  ?  ?  ?  ?  ? ?Cherree Conerly C Guillermo Difrancesco ? ? ? ? ?

## 2022-01-16 ENCOUNTER — Encounter (HOSPITAL_COMMUNITY): Payer: Self-pay | Admitting: Ophthalmology

## 2022-01-17 NOTE — Addendum Note (Signed)
Addendum  created 01/17/22 1108 by Julian Reil, CRNA  ? Charge Capture section accepted  ?  ?

## 2022-05-16 ENCOUNTER — Other Ambulatory Visit: Payer: Self-pay

## 2022-05-17 ENCOUNTER — Encounter: Payer: Self-pay | Admitting: Urology

## 2022-05-17 ENCOUNTER — Ambulatory Visit (INDEPENDENT_AMBULATORY_CARE_PROVIDER_SITE_OTHER): Payer: Medicare Other | Admitting: Urology

## 2022-05-17 VITALS — BP 139/83 | HR 61 | Ht 67.0 in | Wt 158.0 lb

## 2022-05-17 DIAGNOSIS — N201 Calculus of ureter: Secondary | ICD-10-CM

## 2022-05-17 DIAGNOSIS — N132 Hydronephrosis with renal and ureteral calculous obstruction: Secondary | ICD-10-CM

## 2022-05-17 DIAGNOSIS — N200 Calculus of kidney: Secondary | ICD-10-CM

## 2022-05-17 LAB — URINALYSIS, ROUTINE W REFLEX MICROSCOPIC
Bilirubin, UA: NEGATIVE
Glucose, UA: NEGATIVE
Leukocytes,UA: NEGATIVE
Nitrite, UA: NEGATIVE
Specific Gravity, UA: >1.03 — ABNORMAL HIGH (ref 1.005–1.030)
Urobilinogen, Ur: 0.2 mg/dL (ref 0.2–1.0)
pH, UA: 5.5 (ref 5.0–7.5)

## 2022-05-17 LAB — MICROSCOPIC EXAMINATION
Bacteria, UA: NONE SEEN
Renal Epithel, UA: NONE SEEN /hpf
WBC, UA: NONE SEEN /hpf (ref 0–5)

## 2022-05-17 MED ORDER — HYDROCODONE-ACETAMINOPHEN 5-325 MG PO TABS: 1.0000 | ORAL_TABLET | ORAL | 0 refills | Status: DC | PRN

## 2022-05-17 MED ORDER — TAMSULOSIN HCL 0.4 MG PO CAPS: 0.4000 mg | ORAL_CAPSULE | Freq: Every day | ORAL | 1 refills | Status: DC

## 2022-05-17 MED ORDER — HYDROCODONE-ACETAMINOPHEN 5-325 MG PO TABS: 1.0000 | ORAL_TABLET | Freq: Four times a day (QID) | ORAL | 0 refills | Status: DC | PRN

## 2022-05-17 NOTE — Progress Notes (Signed)
Subjective: 1. Kidney stone   2. Hydronephrosis with obstructing calculus      Consult requested by [facility] ED  Nathaniel Huff is a 56 yo male who was seen in the [facility] ED on 05/13/22 for left flank pain and was found to have a 60m left UPJ stone.  The pain was severe with nausea.  He has no hematuria.  He has only mild pain today.  He has no voiding complaints.  He has had no prior stones. He has had no GU surgery or UTI's.   ROS:  Review of Systems  All other systems reviewed and are negative.   Allergies  Allergen Reactions   Bee Venom    Pravastatin     Other reaction(s): Muscle Pain   Diphenhydramine Hcl Nausea Only   Sulfamethoxazole-Trimethoprim Rash    Past Medical History:  Diagnosis Date   Mentally challenged    slightly    Past Surgical History:  Procedure Laterality Date   CATARACT EXTRACTION W/PHACO Left 01/05/2022   Procedure: CATARACT EXTRACTION [place] AND [place]TRAOCULAR [place]AC[place]  Surgeon: WBaruch Goldmann MD;  Location: [facility];  Service: Ophthalmology;  Laterality: Left;  ODE: 3.79   CATARACT EXTRACTION W/PHACO Right 01/15/2022   Procedure: CATARACT EXTRACTION PHACO AND INTRAOCULAR LENS PLACEMENT (IOC);  Surgeon: WBaruch Goldmann MD;  Location: [facility];  Service: Ophthalmology;  Laterality: Right;  CDE: 2.72     Social History   Socioeconomic History   Marital status: Single    Spouse name: Not on file   Number of children: Not on file   Years of education: Not on file   Highest education level: Not on file  Occupational History   Not on file  Tobacco Use   Smoking status: Never   Smokeless tobacco: Never  Substance and Sexual Activity   Alcohol use: Not Currently   Drug use: Not Currently   Sexual activity: Not on file  Other Topics Concern   Not on file  Social History Narrative   Not on file   Social Determinants of Health   Financial Resource Strain: Not on file  Food Insecurity: Not on file  Transportation Needs: Not on file  Physical  Activity: Not on file  Stress: Not on file  Social Connections: Not on file  Intimate Partner Violence: Not on file    No family history on file.  Anti-infectives: Anti-infectives (From admission, onward)    None       Current Outpatient Medications  Medication Sig Dispense Refill   atorvastatin (LIPITOR) 10 MG tablet Take 1 tablet by mouth daily.     acetaminophen (TYLENOL) 325 MG tablet Take by mouth.     atorvastatin (LIPITOR) 10 MG tablet Take 10 mg by mouth daily.     HYDROcodone-acetaminophen (NORCO/VICODIN) 5-325 MG tablet Take 1 tablet by mouth every 4 (four) hours as needed. 10 tablet 0   nystatin cream (MYCOSTATIN) Apply topically 2 (two) times daily.     omeprazole (PRILOSEC) 20 MG capsule Take 20 mg by mouth daily.     ondansetron (ZOFRAN-ODT) 4 MG disintegrating tablet Take 4 mg by mouth every 8 (eight) hours as needed.     tamsulosin (FLOMAX) 0.4 MG CAPS capsule Take 0.4 mg by mouth daily.     No current facility-administered medications for this visit.     Objective: Vital signs in last 24 hours: BP 139/83   Pulse 61   Ht 5' 7"  (1.702 m)   Wt 158 lb (71.7 kg)  BMI 24.75 kg/m   Intake/Output from previous day: No intake/output data recorded. Intake/Output this shift: @IOTHISSHIFT @   Physical Exam Vitals reviewed.  Constitutional:      Appearance: Normal appearance.  Cardiovascular:     Rate and Rhythm: Normal rate and regular rhythm.     Heart sounds: Normal heart sounds.  Pulmonary:     Effort: Pulmonary effort is normal. No respiratory distress.     Breath sounds: Normal breath sounds.  Abdominal:     General: Abdomen is flat.     Palpations: Abdomen is soft.     Tenderness: There is left CVA tenderness.  Musculoskeletal:        General: Normal range of motion.  Skin:    General: Skin is warm and dry.  Neurological:     General: No focal deficit present.     Mental Status: He is alert and oriented to person, place, and time.   Psychiatric:        Mood and Affect: Mood normal.        Behavior: Behavior normal.     Lab Results:  Results for orders placed or performed in visit on 05/17/22 (from the past 24 hour(s))  Urinalysis, Routine w reflex microscopic     Status: Abnormal   Collection Time: 05/17/22 12:08 PM  Result Value Ref Range   Specific Gravity, UA >1.030 (H) 1.005 - 1.030   pH, UA 5.5 5.0 - 7.5   Color, UA Amber (A) Yellow   Appearance Ur Clear Clear   Leukocytes,UA Negative Negative   Protein,UA 2+ (A) Negative/Trace   Glucose, UA Negative Negative   Ketones, UA Trace (A) Negative   RBC, UA Trace (A) Negative   Bilirubin, UA Negative Negative   Urobilinogen, Ur 0.2 0.2 - 1.0 mg/dL   Nitrite, UA Negative Negative   Microscopic Examination See below:    Narrative   Performed at:  [facility] [address] [place], [place]  [id number] Lab Director: Mina Marble MT, Phone:  [phone]  Microscopic Examination     Status: None   Collection Time: 05/17/22 12:08 PM   Urine  Result Value Ref Range   WBC, UA None seen 0 - 5 /hpf   RBC, Urine 0-2 0 - 2 /hpf   Epithelial Cells (non renal) 0-10 0 - 10 /hpf   Renal Epithel, UA None seen None seen /hpf   Mucus, UA Present Not Estab.   Bacteria, UA None seen None seen/Few   Narrative   Performed at:  [facility] [address], [place], [place]  [id number] Lab Director: [place], Phone:  [phone]    BMET No results for input(s): "NA", "K", "CL", "CO2", "GLUCOSE", "BUN", "CREATININE", "CALCIUM" in the last 72 hours. PT/INR No results for input(s): "LABPROT", "INR" in the last 72 hours. ABG No results for input(s): "PHART", "HCO3" in the last 72 hours.  Invalid input(s): "PCO2", "PO2" UA is unremarkable.  Studies/Results: No results found. CT from [facility] reviewed.    Assessment/Plan: 11m LUPJ stone with obstruction.  His pain has improved.   I discussed MET vs ESWL vs URS and he would like to  give the stone some time to pass.   I sent tamsulosin and norco.    Meds ordered this encounter  Medications   HYDROcodone-acetaminophen (NORCO/VICODIN) 5-325 MG tablet    Sig: Take 1 tablet by mouth every 4 (four) hours as needed.    Dispense:  10 tablet  Refill:  0     Orders Placed This Encounter  Procedures   Microscopic Examination   DG Abd 1 View    Standing Status:   Future    Standing Expiration Date:   11/17/2022    Order Specific Question:   Reason for Exam (SYMPTOM  OR DIAGNOSIS REQUIRED)    Answer:   kidney stone    Order Specific Question:   Preferred imaging location?    Answer:   [facility]    Order Specific Question:   Radiology Contrast Protocol - do NOT remove file path    Answer:   [email or web address]   Urinalysis, Routine w reflex microscopic     Return in about 2 weeks (around 05/31/2022).        Irine Seal 05/18/2022 [phone]

## 2022-05-18 ENCOUNTER — Encounter: Payer: Self-pay | Admitting: Urology

## 2022-05-31 ENCOUNTER — Ambulatory Visit (HOSPITAL_COMMUNITY)
Admission: RE | Admit: 2022-05-31 | Discharge: 2022-05-31 | Disposition: A | Discharge status: Home / Self Care | Payer: Medicare Other | Source: Ambulatory Visit | Attending: Urology | Admitting: Urology

## 2022-05-31 ENCOUNTER — Ambulatory Visit (INDEPENDENT_AMBULATORY_CARE_PROVIDER_SITE_OTHER): Payer: Medicare Other | Admitting: Physician Assistant

## 2022-05-31 ENCOUNTER — Other Ambulatory Visit: Payer: Self-pay

## 2022-05-31 ENCOUNTER — Ambulatory Visit: Payer: Self-pay | Admitting: Physician Assistant

## 2022-05-31 VITALS — BP 130/81 | HR 56

## 2022-05-31 DIAGNOSIS — N201 Calculus of ureter: Secondary | ICD-10-CM

## 2022-05-31 DIAGNOSIS — N200 Calculus of kidney: Secondary | ICD-10-CM | POA: Insufficient documentation

## 2022-05-31 LAB — MICROSCOPIC EXAMINATION
Bacteria, UA: NONE SEEN
Epithelial Cells (non renal): NONE SEEN /hpf (ref 0–10)
RBC, Urine: NONE SEEN /hpf (ref 0–2)
Renal Epithel, UA: NONE SEEN /hpf
WBC, UA: NONE SEEN /hpf (ref 0–5)

## 2022-05-31 LAB — URINALYSIS, ROUTINE W REFLEX MICROSCOPIC
Bilirubin, UA: NEGATIVE
Glucose, UA: NEGATIVE
Ketones, UA: NEGATIVE
Leukocytes,UA: NEGATIVE
Nitrite, UA: NEGATIVE
RBC, UA: NEGATIVE
Specific Gravity, UA: 1.025 (ref 1.005–1.030)
Urobilinogen, Ur: 0.2 mg/dL (ref 0.2–1.0)
pH, UA: 5.5 (ref 5.0–7.5)

## 2022-05-31 MED ORDER — OXYCODONE-ACETAMINOPHEN 5-325 MG PO TABS
1.0000 | ORAL_TABLET | ORAL | 0 refills | Status: DC | PRN
Start: 2022-05-31 — End: 2022-08-02

## 2022-05-31 MED ORDER — TAMSULOSIN HCL 0.4 MG PO CAPS: 0.4000 mg | ORAL_CAPSULE | Freq: Every day | ORAL | 2 refills | Status: DC

## 2022-05-31 NOTE — Patient Instructions (Signed)
Dear Mr. Nathaniel Huff,   Thank you for choosing [facility] [place] to assist in your urologic care for your upcoming surgery. The following information below includes specific dates and details related to surgery:   The Surgical Procedure you are scheduled to have performed is Left Extracorporeal shock wave lithotripsy   Surgery Date: 06/05/2022   Physician performing the surgery: Dr. Di Kindle   Do not eat or drink after midnight the day before your surgery.    You will need a driver the day of surgery and will not be able to operate heavy machinery for 24 hours after.    Your surgery will be performed at  [facility] [address] [place] [address]   Enter at the Main Entrance and check in at the SAME DAY SURGERY desk.     Pre-Admit Testing Info   Pre- Admit appointments are interview with an anesthesiologist or a pre-operative anesthesia nurse. These appointments are typically completed as an in person visit but can take place over the telephone.  You will be contacted to confirm the date and time window.     Post OP Please go by [facility] Radiology department prior to coming to your post op appointment in the office. You will have an xray completed at [facility] and then come directly to your scheduled office visit.    If you have any questions or concerns, please don't hesitate to call the office at [phone]   Thank you,    Alfredo Martinez, RN Clinical Surgery Coordinator [facility]

## 2022-05-31 NOTE — H&P (View-Only) (Signed)
Assessment: Diagnoses and all orders for this visit:  Kidney stone -     Urinalysis, Routine w reflex microscopic -     tamsulosin (FLOMAX) 0.4 MG CAPS capsule; Take 1 capsule (0.4 mg total) by mouth daily.  Obstruction of left ureteropelvic junction due to stone  Other orders -     oxyCODONE-acetaminophen (PERCOCET) 5-325 MG tablet; Take 1 tablet by mouth every 4 (four) hours as needed for severe pain.   Plan: Percocet refilled and the patient is advised to take only when needed.  Colace or Senokot as also recommended while taking this.  New prescription for Flomax also sent to his pharmacy as they never filled the original medication.  Lithotripsy scheduled for next week per patient's request.  -We discussed the management of kidney stones. These options include observation, ureteroscopy, shockwave lithotripsy (ESWL) and percutaneous nephrolithotomy (PCNL). We discussed which options are relevant to the patient's stone(s). We discussed the natural history of kidney stones as well as the complications of untreated stones and the impact on quality of life without treatment as well as with each of the above listed treatments. We also discussed the efficacy of each treatment in its ability to clear the stone burden. With any of these management options I discussed the signs and symptoms of infection and the need for emergent treatment should these be experienced. For each option we discussed the ability of each procedure to clear the patient of their stone burden.   For observation I described the risks which include but are not limited to silent renal damage, life-threatening infection, need for emergent surgery, failure to pass stone and pain.   For ureteroscopy I described the risks which include bleeding, infection, damage to contiguous structures, positioning injury, ureteral stricture, ureteral avulsion, ureteral injury, need for prolonged ureteral stent, inability to perform  ureteroscopy, need for an interval procedure, inability to clear stone burden, stent discomfort/pain, heart attack, stroke, pulmonary embolus and the inherent risks with general anesthesia.   For shockwave lithotripsy I described the risks which include arrhythmia, kidney contusion, kidney hemorrhage, need for transfusion, pain, inability to adequately break up stone, inability to pass stone fragments, Steinstrasse, infection associated with obstructing stones, need for alternate surgical procedure, need for repeat shockwave lithotripsy, MI, CVA, PE and the inherent risks with anesthesia/conscious sedation.   For PCNL I described the risks including positioning injury, pneumothorax, hydrothorax, need for chest tube, inability to clear stone burden, renal laceration, arterial venous fistula or malformation, need for embolization of kidney, loss of kidney or renal function, need for repeat procedure, need for prolonged nephrostomy tube, ureteral avulsion, MI, CVA, PE and the inherent risks of general anesthesia.    Chief Complaint: No chief complaint on file.   HPI: Nathaniel Huff is a 56 y.o. male who presents for continued evaluation of left-sided UPJ stone as diagnosed on August 1.  Patient states he is comfortable and has been taking Percocet once or twice daily to prevent pain.  He is almost out.  He reports his pharmacy never filled the Flomax prescription and he has not been taking it.  He has not passed the stone.  No nausea, vomiting.  No fever or chills.. KUB significant for calcification at the left UPJ consistent with previous CT findings.  It measures approximately 4 x 8 mm. UA= clear  05/17/22 Nathaniel Huff is a 56 yo male who was seen in the [facility] ED on 05/13/22 for left flank pain and was found to have a  24mm left UPJ stone.  The pain was severe with nausea.  He has no hematuria.  He has only mild pain today.  He has no voiding complaints.  He has had no prior stones. He has had no GU surgery or  UTI's.    Portions of the above documentation were copied from a prior visit for review purposes only.  Allergies: Allergies  Allergen Reactions   Bee Venom    Pravastatin     Other reaction(s): Muscle Pain   Diphenhydramine Hcl Nausea Only   Sulfamethoxazole-Trimethoprim Rash    PMH: Past Medical History:  Diagnosis Date   Mentally challenged    slightly    PSH: Past Surgical History:  Procedure Laterality Date   CATARACT EXTRACTION W/PHACO Left 01/05/2022   Procedure: CATARACT EXTRACTION PHACO AND INTRAOCULAR LENS PLACEMENT (IOC);  Surgeon: Fabio Pierce, MD;  Location: [facility];  Service: Ophthalmology;  Laterality: Left;  ODE: 3.79   CATARACT EXTRACTION W/PHACO Right 01/15/2022   Procedure: CATARACT EXTRACTION PHACO AND INTRAOCULAR LENS PLACEMENT (IOC);  Surgeon: Fabio Pierce, MD;  Location: [facility];  Service: Ophthalmology;  Laterality: Right;  CDE: 2.72     SH: Social History   Tobacco Use   Smoking status: Never   Smokeless tobacco: Never  Substance Use Topics   Alcohol use: Not Currently   Drug use: Not Currently    ROS: All other review of systems were reviewed and are negative except what is noted above in HPI  PE: BP 130/81   Pulse (!) 56  GENERAL APPEARANCE:  Well appearing, well developed, well nourished, NAD HEENT:  Atraumatic, normocephalic NECK:  Supple. Trachea midline ABDOMEN:  Soft, non-tender, no masses EXTREMITIES:  Moves all extremities well, without clubbing, cyanosis, or edema NEUROLOGIC:  Alert and oriented x 3, normal gait, CN II-XII grossly intact MENTAL STATUS:  appropriate BACK:  Non-tender to palpation, No CVAT SKIN:  Warm, dry, and intact   Results: Laboratory Data: No results found for: "WBC", "HGB", "HCT", "MCV", "PLT"  No results found for: "CREATININE"  No results found for: "PSA"  No results found for: "TESTOSTERONE"  No results found for: "HGBA1C"  Urinalysis    Component Value Date/Time   APPEARANCEUR Clear  05/17/2022 1208   GLUCOSEU Negative 05/17/2022 1208   BILIRUBINUR Negative 05/17/2022 1208   PROTEINUR 2+ (A) 05/17/2022 1208   NITRITE Negative 05/17/2022 1208   LEUKOCYTESUR Negative 05/17/2022 1208    Lab Results  Component Value Date   LABMICR See below: 05/17/2022   WBCUA None seen 05/17/2022   LABEPIT 0-10 05/17/2022   MUCUS Present 05/17/2022   BACTERIA None seen 05/17/2022    Pertinent Imaging: No results found for this or any previous visit.  No results found for this or any previous visit.  No results found for this or any previous visit.  No results found for this or any previous visit.  No results found for this or any previous visit.  No results found for this or any previous visit.  No results found for this or any previous visit.  No results found for this or any previous visit.  No results found for this or any previous visit (from the past 24 hour(s)).

## 2022-05-31 NOTE — Progress Notes (Signed)
I spoke with Nathaniel Huff. We have discussed possible surgery dates and 06/05/2022 was agreed upon by all parties. Patient given information about surgery date, what to expect pre-operatively and post operatively.    We discussed that a pre-op nurse will be calling to set up the pre-op visit that will take place prior to surgery. Informed patient that our office will communicate any additional care to be provided after surgery.    Patients questions or concerns were discussed during our call. Advised to call our office should there be any additional information, questions or concerns that arise. Patient verbalized understanding.

## 2022-05-31 NOTE — Progress Notes (Signed)
Assessment: Diagnoses and all orders for this visit:  Kidney stone -     Urinalysis, Routine w reflex microscopic -     tamsulosin (FLOMAX) 0.4 MG CAPS capsule; Take 1 capsule (0.4 mg total) by mouth daily.  Obstruction of left ureteropelvic junction due to stone  Other orders -     oxyCODONE-acetaminophen (PERCOCET) 5-325 MG tablet; Take 1 tablet by mouth every 4 (four) hours as needed for severe pain.   Plan: Percocet refilled and the patient is advised to take only when needed.  Colace or Senokot as also recommended while taking this.  New prescription for Flomax also sent to his pharmacy as they never filled the original medication.  Lithotripsy scheduled for next week per patient's request.  -We discussed the management of kidney stones. These options include observation, ureteroscopy, shockwave lithotripsy (ESWL) and percutaneous nephrolithotomy (PCNL). We discussed which options are relevant to the patient's stone(s). We discussed the natural history of kidney stones as well as the complications of untreated stones and the impact on quality of life without treatment as well as with each of the above listed treatments. We also discussed the efficacy of each treatment in its ability to clear the stone burden. With any of these management options I discussed the signs and symptoms of infection and the need for emergent treatment should these be experienced. For each option we discussed the ability of each procedure to clear the patient of their stone burden.   For observation I described the risks which include but are not limited to silent renal damage, life-threatening infection, need for emergent surgery, failure to pass stone and pain.   For ureteroscopy I described the risks which include bleeding, infection, damage to contiguous structures, positioning injury, ureteral stricture, ureteral avulsion, ureteral injury, need for prolonged ureteral stent, inability to perform  ureteroscopy, need for an interval procedure, inability to clear stone burden, stent discomfort/pain, heart attack, stroke, pulmonary embolus and the inherent risks with general anesthesia.   For shockwave lithotripsy I described the risks which include arrhythmia, kidney contusion, kidney hemorrhage, need for transfusion, pain, inability to adequately break up stone, inability to pass stone fragments, Steinstrasse, infection associated with obstructing stones, need for alternate surgical procedure, need for repeat shockwave lithotripsy, MI, CVA, PE and the inherent risks with anesthesia/conscious sedation.   For PCNL I described the risks including positioning injury, pneumothorax, hydrothorax, need for chest tube, inability to clear stone burden, renal laceration, arterial venous fistula or malformation, need for embolization of kidney, loss of kidney or renal function, need for repeat procedure, need for prolonged nephrostomy tube, ureteral avulsion, MI, CVA, PE and the inherent risks of general anesthesia.    Chief Complaint: No chief complaint on file.   HPI: Nathaniel Huff is a 56 y.o. male who presents for continued evaluation of left-sided UPJ stone as diagnosed on August 1.  Patient states he is comfortable and has been taking Percocet once or twice daily to prevent pain.  He is almost out.  He reports his pharmacy never filled the Flomax prescription and he has not been taking it.  He has not passed the stone.  No nausea, vomiting.  No fever or chills.. KUB significant for calcification at the left UPJ consistent with previous CT findings.  It measures approximately 4 x 8 mm. UA= clear  05/17/22 Nathaniel Huff is a 56 yo male who was seen in the [facility] ED on 05/13/22 for left flank pain and was found to have a  7mm left UPJ stone.  The pain was severe with nausea.  He has no hematuria.  He has only mild pain today.  He has no voiding complaints.  He has had no prior stones. He has had no GU surgery or  UTI's.    Portions of the above documentation were copied from a prior visit for review purposes only.  Allergies: Allergies  Allergen Reactions   Bee Venom    Pravastatin     Other reaction(s): Muscle Pain   Diphenhydramine Hcl Nausea Only   Sulfamethoxazole-Trimethoprim Rash    PMH: Past Medical History:  Diagnosis Date   Mentally challenged    slightly    PSH: Past Surgical History:  Procedure Laterality Date   CATARACT EXTRACTION W/PHACO Left 01/05/2022   Procedure: CATARACT EXTRACTION PHACO AND INTRAOCULAR LENS PLACEMENT (IOC);  Surgeon: Wrzosek, James, MD;  Location: [facility];  Service: Ophthalmology;  Laterality: Left;  ODE: 3.79   CATARACT EXTRACTION W/PHACO Right 01/15/2022   Procedure: CATARACT EXTRACTION PHACO AND INTRAOCULAR LENS PLACEMENT (IOC);  Surgeon: Wrzosek, James, MD;  Location: [facility];  Service: Ophthalmology;  Laterality: Right;  CDE: 2.72     SH: Social History   Tobacco Use   Smoking status: Never   Smokeless tobacco: Never  Substance Use Topics   Alcohol use: Not Currently   Drug use: Not Currently    ROS: All other review of systems were reviewed and are negative except what is noted above in HPI  PE: BP 130/81   Pulse (!) 56  GENERAL APPEARANCE:  Well appearing, well developed, well nourished, NAD HEENT:  Atraumatic, normocephalic NECK:  Supple. Trachea midline ABDOMEN:  Soft, non-tender, no masses EXTREMITIES:  Moves all extremities well, without clubbing, cyanosis, or edema NEUROLOGIC:  Alert and oriented x 3, normal gait, CN II-XII grossly intact MENTAL STATUS:  appropriate BACK:  Non-tender to palpation, No CVAT SKIN:  Warm, dry, and intact   Results: Laboratory Data: No results found for: "WBC", "HGB", "HCT", "MCV", "PLT"  No results found for: "CREATININE"  No results found for: "PSA"  No results found for: "TESTOSTERONE"  No results found for: "HGBA1C"  Urinalysis    Component Value Date/Time   APPEARANCEUR Clear  05/17/2022 1208   GLUCOSEU Negative 05/17/2022 1208   BILIRUBINUR Negative 05/17/2022 1208   PROTEINUR 2+ (A) 05/17/2022 1208   NITRITE Negative 05/17/2022 1208   LEUKOCYTESUR Negative 05/17/2022 1208    Lab Results  Component Value Date   LABMICR See below: 05/17/2022   WBCUA None seen 05/17/2022   LABEPIT 0-10 05/17/2022   MUCUS Present 05/17/2022   BACTERIA None seen 05/17/2022    Pertinent Imaging: No results found for this or any previous visit.  No results found for this or any previous visit.  No results found for this or any previous visit.  No results found for this or any previous visit.  No results found for this or any previous visit.  No results found for this or any previous visit.  No results found for this or any previous visit.  No results found for this or any previous visit.  No results found for this or any previous visit (from the past 24 hour(s)).  

## 2022-06-04 ENCOUNTER — Encounter (HOSPITAL_COMMUNITY): Payer: Self-pay

## 2022-06-04 ENCOUNTER — Other Ambulatory Visit: Payer: Self-pay

## 2022-06-04 ENCOUNTER — Encounter (HOSPITAL_COMMUNITY)
Admission: RE | Admit: 2022-06-04 | Discharge: 2022-06-04 | Disposition: A | Discharge status: Home / Self Care | Payer: Medicare Other | Source: Ambulatory Visit | Attending: Urology | Admitting: Urology

## 2022-06-04 NOTE — Pre-Procedure Instructions (Signed)
Called to do pre-op call. Patient's brother, Corena Pilgrim states that patient is mentally challenged and he takes care of patients finances and health care. He dose not have legal documents but is patients next of kin and has "always done this for patient". He will be bringing patient tomorrow and taking care of him  afterwards. He knows patients full medical history and I discussed pre and post op instructions with him. He has the blue folder and will bring that when they come. Bobby verbalizes understanding of all instructions.

## 2022-06-05 ENCOUNTER — Ambulatory Visit (HOSPITAL_COMMUNITY): Payer: Medicare Other

## 2022-06-05 ENCOUNTER — Encounter (HOSPITAL_COMMUNITY)
Admission: RE | Disposition: A | Discharge status: Home / Self Care | Payer: Self-pay | Source: Ambulatory Visit | Attending: Urology

## 2022-06-05 ENCOUNTER — Encounter (HOSPITAL_COMMUNITY): Payer: Self-pay | Admitting: Urology

## 2022-06-05 ENCOUNTER — Ambulatory Visit (HOSPITAL_COMMUNITY)
Admission: RE | Admit: 2022-06-05 | Discharge: 2022-06-05 | Disposition: A | Discharge status: Home / Self Care | Payer: Medicare Other | Source: Ambulatory Visit | Attending: Urology | Admitting: Urology

## 2022-06-05 DIAGNOSIS — N201 Calculus of ureter: Secondary | ICD-10-CM | POA: Diagnosis present

## 2022-06-05 HISTORY — PX: EXTRACORPOREAL SHOCK WAVE LITHOTRIPSY: SHX1557

## 2022-06-05 SURGERY — LITHOTRIPSY, ESWL
Anesthesia: LOCAL | Laterality: Left

## 2022-06-05 MED ORDER — SODIUM CHLORIDE 0.9 % IV SOLN
Freq: Once | INTRAVENOUS | Status: AC
Start: 2022-06-05 — End: 2022-06-05

## 2022-06-05 MED ORDER — DIAZEPAM 5 MG PO TABS
10.0000 mg | ORAL_TABLET | Freq: Once | ORAL | Status: AC
  Administered 2022-06-05: 10 mg via ORAL

## 2022-06-05 MED ORDER — DIAZEPAM 5 MG PO TABS
ORAL_TABLET | ORAL | Status: AC
  Filled 2022-06-05: qty 2

## 2022-06-05 NOTE — Interval H&P Note (Signed)
History and Physical Interval Note:  06/05/2022 9:00 AM  Nathaniel Huff  has presented today for surgery, with the diagnosis of left UPJ stone.  The various methods of treatment have been discussed with the patient and family. After consideration of risks, benefits and other options for treatment, the patient has consented to  Procedure(s): EXTRACORPOREAL SHOCK WAVE LITHOTRIPSY (ESWL) (Left) as a surgical intervention.  The patient's history has been reviewed, patient examined, no change in status, stable for surgery.  I have reviewed the patient's chart and labs.  Questions were answered to the patient's satisfaction.     Di Kindle

## 2022-06-07 ENCOUNTER — Encounter (HOSPITAL_COMMUNITY): Payer: Self-pay | Admitting: Urology

## 2022-06-18 ENCOUNTER — Ambulatory Visit (HOSPITAL_COMMUNITY)
Admission: RE | Admit: 2022-06-18 | Discharge: 2022-06-18 | Disposition: A | Discharge status: Home / Self Care | Payer: Medicare Other | Source: Ambulatory Visit | Attending: Physician Assistant | Admitting: Physician Assistant

## 2022-06-18 ENCOUNTER — Ambulatory Visit (INDEPENDENT_AMBULATORY_CARE_PROVIDER_SITE_OTHER): Payer: Medicare Other | Admitting: Physician Assistant

## 2022-06-18 VITALS — BP 134/81 | HR 61

## 2022-06-18 DIAGNOSIS — N200 Calculus of kidney: Secondary | ICD-10-CM | POA: Insufficient documentation

## 2022-06-18 DIAGNOSIS — Z87442 Personal history of urinary calculi: Secondary | ICD-10-CM

## 2022-06-18 LAB — URINALYSIS, ROUTINE W REFLEX MICROSCOPIC
Bilirubin, UA: NEGATIVE
Glucose, UA: NEGATIVE
Leukocytes,UA: NEGATIVE
Nitrite, UA: NEGATIVE
Specific Gravity, UA: >1.03 — ABNORMAL HIGH (ref 1.005–1.030)
Urobilinogen, Ur: 1 mg/dL (ref 0.2–1.0)
pH, UA: 5 (ref 5.0–7.5)

## 2022-06-18 LAB — MICROSCOPIC EXAMINATION
RBC, Urine: >30 /hpf — AB (ref 0–2)
Renal Epithel, UA: NONE SEEN /hpf
WBC, UA: NONE SEEN /hpf (ref 0–5)

## 2022-06-18 NOTE — Progress Notes (Signed)
Assessment: 1. Kidney stone    Plan: Stone prevention again discussed. Maintain adequate hydration. FU in 6 weeks after Renal US. Call or return if pt has concerns prior to next OV.   Chief Complaint: No chief complaint on file.   HPI: Nathaniel Huff is a 56 y.o. male who presents for post op evaluation of ESWL of left sided UPJ stone performed on 06/05/22. Doing well without pain. Minimal LUTS. No stone passage yet. No gross hematuria. He continues to strain his urine   UA= RBCs, otherwise clear KUB-Previous UPJ stone no longer apparent. Distal views difficult to visualize due to significant stool burden  Portions of the above documentation were copied from a prior visit for review purposes only.  Allergies: Allergies  Allergen Reactions   Bee Venom    Pravastatin     Other reaction(s): Muscle Pain   Diphenhydramine Hcl Nausea Only   Sulfamethoxazole-Trimethoprim Rash    PMH: Past Medical History:  Diagnosis Date   Mentally challenged    slightly    PSH: Past Surgical History:  Procedure Laterality Date   CATARACT EXTRACTION W/PHACO Left 01/05/2022   Procedure: CATARACT EXTRACTION PHACO AND INTRAOCULAR LENS PLACEMENT (IOC);  Surgeon: Fabio Pierce, MD;  Location: [facility];  Service: Ophthalmology;  Laterality: Left;  ODE: 3.79   CATARACT EXTRACTION W/PHACO Right 01/15/2022   Procedure: CATARACT EXTRACTION PHACO AND INTRAOCULAR LENS PLACEMENT (IOC);  Surgeon: Fabio Pierce, MD;  Location: [facility];  Service: Ophthalmology;  Laterality: Right;  CDE: 2.72    EXTRACORPOREAL SHOCK WAVE LITHOTRIPSY Left 06/05/2022   Procedure: EXTRACORPOREAL SHOCK WAVE LITHOTRIPSY (ESWL);  Surgeon: Milderd Meager., MD;  Location: [facility];  Service: Urology;  Laterality: Left;    SH: Social History   Tobacco Use   Smoking status: Never   Smokeless tobacco: Never  Vaping Use   Vaping Use: Never used  Substance Use Topics   Alcohol use: Not Currently   Drug use: Not Currently     ROS: All other review of systems were reviewed and are negative except what is noted above in HPI  PE: BP 134/81   Pulse 61  GENERAL APPEARANCE:  Well appearing, well developed, NAD HEENT:  Atraumatic, normocephalic NECK:  Supple. Trachea midline ABDOMEN:  Soft, non-tender, no masses, no CVAT EXTREMITIES:  Moves all extremities well NEUROLOGIC:  Alert and oriented x 3 MENTAL STATUS:  appropriate SKIN:  Warm, dry, and intact   Results: Laboratory Data: No results found for: "WBC", "HGB", "HCT", "MCV", "PLT"   Urinalysis    Component Value Date/Time   APPEARANCEUR Clear 05/31/2022 1020   GLUCOSEU Negative 05/31/2022 1020   BILIRUBINUR Negative 05/31/2022 1020   PROTEINUR 1+ (A) 05/31/2022 1020   NITRITE Negative 05/31/2022 1020   LEUKOCYTESUR Negative 05/31/2022 1020    Lab Results  Component Value Date   LABMICR See below: 05/31/2022   WBCUA None seen 05/31/2022   LABEPIT None seen 05/31/2022   MUCUS Present 05/31/2022   BACTERIA None seen 05/31/2022    Pertinent Imaging: Results for orders placed during the hospital encounter of 06/05/22  DG Abd 1 View  Narrative CLINICAL DATA:  Pre lithotripsy  Right nephrolithiasis  EXAM: ABDOMEN - 1 VIEW  COMPARISON:  None Available.  FINDINGS: Previously seen 7 mm calculus at the left ureteropelvic junction is unchanged in position. No new calcifications are identified.  No dilated loops of bowel to indicate ileus or obstruction.  IMPRESSION: Unchanged 7 mm left ureteropelvic junction calculus.   Electronically  Signed By: Acquanetta Belling M.D. On: 06/05/2022 08:32  No results found for this or any previous visit.  No results found for this or any previous visit.  No results found for this or any previous visit.  No results found for this or any previous visit.  No results found for this or any previous visit.  No results found for this or any previous visit.  No results found for this or any  previous visit.  No results found for this or any previous visit (from the past 24 hour(s)).

## 2022-07-25 ENCOUNTER — Ambulatory Visit (HOSPITAL_COMMUNITY)
Admission: RE | Admit: 2022-07-25 | Discharge: 2022-07-25 | Disposition: A | Discharge status: Home / Self Care | Payer: Medicare Other | Source: Ambulatory Visit | Attending: Physician Assistant | Admitting: Physician Assistant

## 2022-07-25 DIAGNOSIS — N200 Calculus of kidney: Secondary | ICD-10-CM | POA: Insufficient documentation

## 2022-08-02 ENCOUNTER — Ambulatory Visit (INDEPENDENT_AMBULATORY_CARE_PROVIDER_SITE_OTHER): Payer: Medicare Other | Admitting: Physician Assistant

## 2022-08-02 ENCOUNTER — Encounter: Payer: Self-pay | Admitting: Physician Assistant

## 2022-08-02 VITALS — BP 123/81 | HR 63

## 2022-08-02 DIAGNOSIS — K5900 Constipation, unspecified: Secondary | ICD-10-CM | POA: Diagnosis not present

## 2022-08-02 DIAGNOSIS — N200 Calculus of kidney: Secondary | ICD-10-CM

## 2022-08-02 DIAGNOSIS — Z87442 Personal history of urinary calculi: Secondary | ICD-10-CM | POA: Diagnosis not present

## 2022-08-02 NOTE — Progress Notes (Signed)
Assessment: 1. Nephrolithiasis  2. Constipation, unspecified constipation type    Plan: Stone prevention again discussed. Maintain adequate hydration. FU in 6 months after Renal US. Call or return if pt has concerns prior to next OV. Add Miralax for constipation and continue colace prn.   Chief Complaint: No chief complaint on file.   HPI: Nathaniel Huff is a 56 y.o. male who presents for post op evaluation of Left sided UPJ stone tx with ESWL on 06/06/22. He has passed no particles that he has noticed. Minimal LUTs today. Does c/o occasional burning at onset of stream. No fever, gross hematuria. Pt on stool softener which is helping with constipation noted on prev. Imaging.   UA= clear, but appears quite concentrated on gross exam  Renal US: B stones noted. No hydro.    Portions of the above documentation were copied from a prior visit for review purposes only.  Allergies: Allergies  Allergen Reactions   Bee Venom    Pravastatin     Other reaction(s): Muscle Pain   Diphenhydramine Hcl Nausea Only   Sulfamethoxazole-Trimethoprim Rash    PMH: Past Medical History:  Diagnosis Date   Mentally challenged    slightly    PSH: Past Surgical History:  Procedure Laterality Date   CATARACT EXTRACTION W/PHACO Left 01/05/2022   Procedure: CATARACT EXTRACTION PHACO AND INTRAOCULAR LENS PLACEMENT (IOC);  Surgeon: Baruch Goldmann, MD;  Location: [facility];  Service: Ophthalmology;  Laterality: Left;  ODE: 3.79   CATARACT EXTRACTION W/PHACO Right 01/15/2022   Procedure: CATARACT EXTRACTION PHACO AND INTRAOCULAR LENS PLACEMENT (IOC);  Surgeon: Baruch Goldmann, MD;  Location: [facility];  Service: Ophthalmology;  Laterality: Right;  CDE: 2.72    EXTRACORPOREAL SHOCK WAVE LITHOTRIPSY Left 2022/06/06   Procedure: EXTRACORPOREAL SHOCK WAVE LITHOTRIPSY (ESWL);  Surgeon: Primus Bravo., MD;  Location: [facility];  Service: Urology;  Laterality: Left;    SH: Social History   Tobacco Use    Smoking status: Never   Smokeless tobacco: Never  Vaping Use   Vaping Use: Never used  Substance Use Topics   Alcohol use: Not Currently   Drug use: Not Currently    ROS: All other review of systems were reviewed and are negative except what is noted above in HPI  PE: BP 123/81   Pulse 63  GENERAL APPEARANCE:  Well appearing, well developed, NAD HEENT:  Atraumatic, normocephalic NECK:  Supple. Trachea midline ABDOMEN:  Soft, non-tender, no masses, no CVAT EXTREMITIES:  Moves all extremities well NEUROLOGIC:  Alert and oriented x 3 MENTAL STATUS:  appropriate SKIN:  Warm, dry, and intact   Results: Laboratory Data: No results found for: "WBC", "HGB", "HCT", "MCV", "PLT"  No results found for: "CREATININE"  No results found for: "PSA"  No results found for: "TESTOSTERONE"  No results found for: "HGBA1C"  Urinalysis    Component Value Date/Time   APPEARANCEUR Clear 06/18/2022 1129   GLUCOSEU Negative 06/18/2022 1129   BILIRUBINUR Negative 06/18/2022 1129   PROTEINUR 2+ (A) 06/18/2022 1129   NITRITE Negative 06/18/2022 1129   LEUKOCYTESUR Negative 06/18/2022 1129    Lab Results  Component Value Date   LABMICR See below: 06/18/2022   WBCUA None seen 06/18/2022   LABEPIT 0-10 06/18/2022   MUCUS Present 06/18/2022   BACTERIA Few 06/18/2022    Pertinent Imaging: Results for orders placed during the hospital encounter of 06/18/22  DG Abd 1 View  Narrative CLINICAL DATA:  Post ESWL; burning with urination  EXAM: ABDOMEN -  1 VIEW  COMPARISON:  Radiographs 06/05/2022  FINDINGS: The previously seen 7 mm calculus at the left ureteropelvic junction is no longer visualized. No new radiopaque calculi. Nonobstructive bowel-gas pattern. Large colonic stool burden.  IMPRESSION: The previous 7 mm calculus at the left UPJ is no longer visualized. No new radiopaque calculi.  Large colonic stool burden.   Electronically Signed By: Placido Sou M.D. On:  06/19/2022 01:53  No results found for this or any previous visit.  No results found for this or any previous visit.  No results found for this or any previous visit.  Results for orders placed during the hospital encounter of 07/25/22  Ultrasound renal complete  Narrative CLINICAL DATA:  Left UPJ stone.  Status post ESWL June 05, 2022  EXAM: RENAL / URINARY TRACT ULTRASOUND COMPLETE  COMPARISON:  None Available.  FINDINGS: Right Kidney:  Renal measurements: 10.6 x 5.5 x 6.0 cm = volume: 184 mL. Contains a 6 mm probable nonobstructive stone.  Left Kidney:  Renal measurements: 11.0 x 5.4 x 4.9 cm = volume: 155 mL. Contains a 3.7 mm stone. Mild central pelvicaliectasis.  Bladder:  Appears normal for degree of bladder distention.  Other:  None.  IMPRESSION: 1. Bilateral stones in the kidneys. 2. Mild central pelvicaliectasis on the left. 3. The bladder is unremarkable.   Electronically Signed By: Dorise Bullion III M.D. On: 07/25/2022 12:25  No valid procedures specified. No results found for this or any previous visit.  No results found for this or any previous visit.  No results found for this or any previous visit (from the past 24 hour(s)).

## 2022-08-02 NOTE — Addendum Note (Signed)
Addended by: Dorisann Frames on: 08/02/2022 01:04 PM   Modules accepted: Orders

## 2022-08-02 NOTE — Patient Instructions (Signed)
Add Miralax at night for constipation

## 2022-08-03 LAB — URINALYSIS, ROUTINE W REFLEX MICROSCOPIC
Bilirubin, UA: NEGATIVE
Glucose, UA: NEGATIVE
Ketones, UA: NEGATIVE
Leukocytes,UA: NEGATIVE
Nitrite, UA: NEGATIVE
Protein,UA: NEGATIVE
RBC, UA: NEGATIVE
Specific Gravity, UA: 1.025 (ref 1.005–1.030)
Urobilinogen, Ur: 0.2 mg/dL (ref 0.2–1.0)
pH, UA: 5.5 (ref 5.0–7.5)

## 2023-01-24 ENCOUNTER — Ambulatory Visit (HOSPITAL_COMMUNITY): Payer: Medicare Other | Attending: Physician Assistant

## 2023-01-28 ENCOUNTER — Ambulatory Visit: Payer: Medicare Other | Admitting: Urology

## 2023-04-16 ENCOUNTER — Other Ambulatory Visit: Payer: Self-pay

## 2023-04-16 ENCOUNTER — Other Ambulatory Visit: Payer: Self-pay | Admitting: Urology

## 2023-04-16 DIAGNOSIS — Z87898 Personal history of other specified conditions: Secondary | ICD-10-CM | POA: Insufficient documentation

## 2023-04-16 DIAGNOSIS — N200 Calculus of kidney: Secondary | ICD-10-CM

## 2023-04-16 DIAGNOSIS — F70 Mild intellectual disabilities: Secondary | ICD-10-CM | POA: Insufficient documentation

## 2023-04-16 DIAGNOSIS — J309 Allergic rhinitis, unspecified: Secondary | ICD-10-CM | POA: Insufficient documentation

## 2023-04-16 DIAGNOSIS — E7800 Pure hypercholesterolemia, unspecified: Secondary | ICD-10-CM | POA: Insufficient documentation

## 2023-04-16 DIAGNOSIS — R519 Headache, unspecified: Secondary | ICD-10-CM | POA: Insufficient documentation

## 2023-04-16 DIAGNOSIS — K219 Gastro-esophageal reflux disease without esophagitis: Secondary | ICD-10-CM | POA: Insufficient documentation

## 2023-04-16 NOTE — Progress Notes (Signed)
Opened in error

## 2023-04-16 NOTE — Progress Notes (Deleted)
Name: Nathaniel Huff DOB: [birth year] MRN: [id number]  History of Present Illness: Nathaniel Huff is a 57 y.o. male who presents today for follow up visit at Nathaniel [facility]. - GU History: 1. Kidney stones. - 06/05/2022: Underwent left ESWL by Nathaniel Huff. 2. Elevated PSA. - See values below. - Per CT abdomen/pelvis w/o contrast on 02/12/2023: "The prostate and seminal vesicles are unremarkable."  PSA values: - 02/06/2018: 4.9 - 02/11/2019: 5.9 - 04/25/2021: 7.2 - 04/26/2022: 5.1  At last visit with Nathaniel Lions, PA on 08/02/2022: - Stone prevention discussed.  - The plan was follow up in 6 months with renal US.  - For constipation: Advised Miralax and continue colace prn.   Since last visit: 02/12/2023:  > Seen by PCP for dysuria and hematuria. Stone passage suspected.  > Went to ER same day.  - CT abdomen/pelvis w/o contrast showed: 1. Nonobstructing bilateral renal stones measuring up to 4-5 mm in the left lower pole. No obstructing stones or hydronephrosis.  2. Fat containing right inguinal hernia.  - Urine culture positive for 10-50k E. Coli. Treated with Cipro.  > No additional urine culture results in past 12 months found per chart review.***  ***recheck PSA soon (has been stable over past few years)  Today: ***KUB today: Awaiting radiology read; *** ***RUS today: Awaiting radiology read; ***   He {Actions; denies-reports:120008} recent stone passage. He {Actions; denies-reports:120008} acute flank pain / abdominal pain. He {Actions; denies-reports:120008} fevers.  He {Actions; denies-reports:120008} nausea/ vomiting.  He urinates *** times per day. He {Actions; denies-reports:120008} urgency. He {Actions; denies-reports:120008} dysuria. He {Actions; denies-reports:120008} gross hematuria. He {Actions; denies-reports:120008} the need to strain to void. He {Actions; denies-reports:120008} sensations of incomplete emptying.   Fall Screening: Do you  usually have a device to assist in your mobility? {yes/no:20286}  ***cane / ***walker / ***wheelchair  Medications: Current Outpatient Medications  Medication Sig Dispense Refill   atorvastatin (LIPITOR) 10 MG tablet Take 10 mg by mouth daily.     atorvastatin (LIPITOR) 10 MG tablet Take 1 tablet by mouth daily.     omeprazole (PRILOSEC) 20 MG capsule Take 20 mg by mouth daily.     No current facility-administered medications for this visit.    Allergies: Allergies  Allergen Reactions   Bee Venom    Pravastatin     Other reaction(s): Muscle Pain   Diphenhydramine Hcl Nausea Only   Sulfamethoxazole-Trimethoprim Rash    Past Medical History:  Diagnosis Date   Mentally challenged    slightly   Past Surgical History:  Procedure Laterality Date   CATARACT EXTRACTION W/PHACO Left 01/05/2022   Procedure: CATARACT EXTRACTION PHACO AND INTRAOCULAR LENS PLACEMENT (IOC);  Surgeon: Fabio Pierce, MD;  Location: [facility];  Service: Ophthalmology;  Laterality: Left;  ODE: 3.79   CATARACT EXTRACTION W/PHACO Right 01/15/2022   Procedure: CATARACT EXTRACTION PHACO AND INTRAOCULAR LENS PLACEMENT (IOC);  Surgeon: Fabio Pierce, MD;  Location: [facility];  Service: Ophthalmology;  Laterality: Right;  CDE: 2.72    EXTRACORPOREAL SHOCK WAVE LITHOTRIPSY Left 06/05/2022   Procedure: EXTRACORPOREAL SHOCK WAVE LITHOTRIPSY (ESWL);  Surgeon: Milderd Meager., MD;  Location: [facility];  Service: Urology;  Laterality: Left;   No family history on file. Social History   Socioeconomic History   Marital status: Single    Spouse name: Not on file   Number of children: Not on file   Years of education: Not on file   Highest education level: Not on file  Occupational History  Not on file  Tobacco Use   Smoking status: Never   Smokeless tobacco: Never  Vaping Use   Vaping Use: Never used  Substance and Sexual Activity   Alcohol use: Not Currently   Drug use: Not Currently   Sexual activity: Not on  file  Other Topics Concern   Not on file  Social History Narrative   Not on file   Social Determinants of Health   Financial Resource Strain: Not on file  Food Insecurity: Not on file  Transportation Needs: Not on file  Physical Activity: Not on file  Stress: Not on file  Social Connections: Not on file  Intimate Partner Violence: Not on file    SUBJECTIVE  Review of Systems Constitutional: Patient ***denies any unintentional weight loss or change in strength lntegumentary: Patient ***denies any rashes or pruritus Eyes: Patient denies ***dry eyes ENT: Patient ***denies dry mouth Cardiovascular: Patient ***denies chest pain or syncope Respiratory: Patient ***denies shortness of breath Gastrointestinal: Patient ***denies nausea, vomiting, constipation, or diarrhea Musculoskeletal: Patient ***denies muscle cramps or weakness Neurologic: Patient ***denies convulsions or seizures Psychiatric: Patient ***denies memory problems Allergic/Immunologic: Patient ***denies recent allergic reaction(s) Hematologic/Lymphatic: Patient denies bleeding tendencies Endocrine: Patient ***denies heat/cold intolerance  GU: As per HPI.  OBJECTIVE There were no vitals filed for this visit. There is no height or weight on file to calculate BMI.  Physical Examination  Constitutional: ***No obvious distress; patient is ***non-toxic appearing  Cardiovascular: ***No visible lower extremity edema.  Respiratory: The patient does ***not have audible wheezing/stridor; respirations do ***not appear labored  Gastrointestinal: Abdomen ***non-distended Musculoskeletal: ***Normal ROM of UEs  Skin: ***No obvious rashes/open sores  Neurologic: CN 2-12 grossly ***intact Psychiatric: Answered questions ***appropriately with ***normal affect  Hematologic/Lymphatic/Immunologic: ***No obvious bruises or sites of spontaneous bleeding  UA: {Desc; negative/positive:13464} for *** WBC/hpf, *** RBC/hpf, bacteria  (***) *** nitrites, *** leukocytes, *** blood PVR: *** ml  ASSESSMENT Nephrolithiasis  History of UTI  History of elevated PSA  ***We reviewed recent imaging results; ***awaiting radiology results, appears to have ***no acute findings.  ***For stone prevention: Advised adequate hydration and we discussed option to consider low oxalate diet given that calcium oxalate is the most common type of stone. Handout provided about stone prevention diet.  ***For recurrent stone formers: We discussed option to proceed with 24 hour urinalysis ([facility]) for metabolic evaluation, which may help with targeted recommendations for dietary I medication therapies for stone prevention. Patient elected to ***proceed/ ***hold off.  Will plan to follow up in ***6 months / ***1 year with ***KUB ***RUS for stone surveillance or sooner if needed.  Pt verbalized understanding and agreement. All questions were answered.  PLAN Advised the following: ***Maintain adequate fluid intake. ***Low oxalate diet. No follow-ups on file.  No orders of the defined types were placed in this encounter.   It has been explained that the patient is to follow regularly with their PCP in addition to all other providers involved in their care and to follow instructions provided by these respective offices. Patient advised to contact urology clinic if any urologic-pertaining questions, concerns, new symptoms or problems arise in the interim period.  There are no Patient Instructions on file for this visit.  Electronically signed by:  Donnita Falls, MSN, FNP-C, CUNP 04/16/2023 4:14 PM

## 2023-04-18 ENCOUNTER — Ambulatory Visit: Payer: Self-pay | Admitting: Urology

## 2023-04-18 ENCOUNTER — Ambulatory Visit (HOSPITAL_COMMUNITY)
Admission: RE | Admit: 2023-04-18 | Discharge: 2023-04-18 | Disposition: A | Discharge status: Home / Self Care | Payer: Medicare Other | Source: Ambulatory Visit | Attending: Urology | Admitting: Urology

## 2023-04-18 ENCOUNTER — Ambulatory Visit (INDEPENDENT_AMBULATORY_CARE_PROVIDER_SITE_OTHER): Payer: Medicare Other | Admitting: Urology

## 2023-04-18 ENCOUNTER — Encounter: Payer: Self-pay | Admitting: Urology

## 2023-04-18 ENCOUNTER — Ambulatory Visit: Payer: Medicare Other | Admitting: Urology

## 2023-04-18 ENCOUNTER — Ambulatory Visit (HOSPITAL_COMMUNITY)
Admission: RE | Admit: 2023-04-18 | Discharge: 2023-04-18 | Disposition: A | Discharge status: Home / Self Care | Payer: Medicare Other | Source: Ambulatory Visit | Attending: Physician Assistant | Admitting: Physician Assistant

## 2023-04-18 VITALS — BP 143/83 | HR 52

## 2023-04-18 DIAGNOSIS — R300 Dysuria: Secondary | ICD-10-CM

## 2023-04-18 DIAGNOSIS — R109 Unspecified abdominal pain: Secondary | ICD-10-CM

## 2023-04-18 DIAGNOSIS — R3915 Urgency of urination: Secondary | ICD-10-CM | POA: Diagnosis not present

## 2023-04-18 DIAGNOSIS — Z87898 Personal history of other specified conditions: Secondary | ICD-10-CM

## 2023-04-18 DIAGNOSIS — Z87440 Personal history of urinary (tract) infections: Secondary | ICD-10-CM

## 2023-04-18 DIAGNOSIS — N200 Calculus of kidney: Secondary | ICD-10-CM

## 2023-04-18 DIAGNOSIS — N201 Calculus of ureter: Secondary | ICD-10-CM | POA: Diagnosis not present

## 2023-04-18 LAB — BLADDER SCAN AMB NON-IMAGING: Scan Result: 3

## 2023-04-18 MED ORDER — TAMSULOSIN HCL 0.4 MG PO CAPS: 0.4000 mg | ORAL_CAPSULE | Freq: Every day | ORAL | 1 refills | Status: AC

## 2023-04-18 NOTE — Progress Notes (Signed)
Subjective:  1. Nephrolithiasis   2. Ureteral stone   3. Urgency of urination   4. Flank pain      04/18/23: Nathaniel Huff returns today with a 2 day history of left flank pain with dysuria.  He has increased frequency and urgency.  HIs UA has 30 RBC's and he had some gross hematuria 2 days ago and went to urgent care and got a couple of meds. He got keflex for a possible infection.  He had a renal US had KUB today that showed no hydronephrosis but there was a 3-39mm LUVJ stone which was probably in the LLP on CT in May.     ROS:  ROS:  A complete review of systems was performed.  All systems are negative except for pertinent findings as noted.   ROS  Allergies  Allergen Reactions   Bee Venom    Pravastatin     Other reaction(s): Muscle Pain   Diphenhydramine Hcl Nausea Only   Sulfamethoxazole-Trimethoprim Rash    Outpatient Encounter Medications as of 04/18/2023  Medication Sig   cephALEXin (KEFLEX) 500 MG capsule Take 500 mg by mouth 2 (two) times daily.   tamsulosin (FLOMAX) 0.4 MG CAPS capsule Take 1 capsule (0.4 mg total) by mouth daily.   atorvastatin (LIPITOR) 10 MG tablet Take 10 mg by mouth daily.   atorvastatin (LIPITOR) 10 MG tablet Take 1 tablet by mouth daily.   omeprazole (PRILOSEC) 20 MG capsule Take 20 mg by mouth daily.   No facility-administered encounter medications on file as of 04/18/2023.    Past Medical History:  Diagnosis Date   Mentally challenged    slightly    Past Surgical History:  Procedure Laterality Date   CATARACT EXTRACTION W/PHACO Left 01/05/2022   Procedure: CATARACT EXTRACTION PHACO AND INTRAOCULAR LENS PLACEMENT (IOC);  Surgeon: Fabio Pierce, MD;  Location: [facility];  Service: Ophthalmology;  Laterality: Left;  ODE: 3.79   CATARACT EXTRACTION W/PHACO Right 01/15/2022   Procedure: CATARACT EXTRACTION PHACO AND INTRAOCULAR LENS PLACEMENT (IOC);  Surgeon: Fabio Pierce, MD;  Location: [facility];  Service: Ophthalmology;  Laterality: Right;  CDE:  2.72    EXTRACORPOREAL SHOCK WAVE LITHOTRIPSY Left 06/05/2022   Procedure: EXTRACORPOREAL SHOCK WAVE LITHOTRIPSY (ESWL);  Surgeon: Milderd Meager., MD;  Location: [facility];  Service: Urology;  Laterality: Left;    Social History   Socioeconomic History   Marital status: Single    Spouse name: Not on file   Number of children: Not on file   Years of education: Not on file   Highest education level: Not on file  Occupational History   Not on file  Tobacco Use   Smoking status: Never   Smokeless tobacco: Never  Vaping Use   Vaping status: Never Used  Substance and Sexual Activity   Alcohol use: Not Currently   Drug use: Not Currently   Sexual activity: Not on file  Other Topics Concern   Not on file  Social History Narrative   Not on file   Social Determinants of Health   Financial Resource Strain: Low Risk  (04/26/2022)   Received from [facility], [facility]   Overall Financial Resource Strain (CARDIA)    Difficulty of Paying Living Expenses: Not hard at all  Food Insecurity: No Food Insecurity (02/20/2023)   Received from [facility], [facility]   Hunger Vital Sign    Worried About Running Out of Food in the Last Year: Never true  Ran Out of Food in the Last Year: Never true  Transportation Needs: No Transportation Needs (04/26/2022)   Received from [facility], [facility]   [facility] - Transportation    Lack of Transportation (Medical): No    Lack of Transportation (Non-Medical): No  Physical Activity: Sufficiently Active (04/26/2022)   Received from [facility], [facility]   Exercise Vital Sign    Days of Exercise per Week: 5 days    Minutes of Exercise per Session: 30 min  Stress: No Stress Concern Present (04/26/2022)   Received from [facility], [facility] - Occupational Stress Questionnaire    Feeling of Stress : Not at all  Social Connections: Moderately Integrated  (04/26/2022)   Received from [facility], [facility]   Social Connection and Isolation Panel [NHANES]    Frequency of Communication with Friends and Family: Twice a week    Frequency of Social Gatherings with Friends and Family: Twice a week    Attends Religious Services: 1 to 4 times per year    Active Member of [employer] or Organizations: No    Attends Engineer, structural: 1 to 4 times per year    Marital Status: Never married  Intimate Partner Violence: Not At Risk (04/26/2022)   Received from [facility], [facility]   Humiliation, Afraid, Rape, and Kick questionnaire    Fear of Current or Ex-Partner: No    Emotionally Abused: No    Physically Abused: No    Sexually Abused: No    No family history on file.     Objective: Vitals:   04/18/23 1504  BP: (!) 143/83  Pulse: (!) 52     Physical Exam Vitals reviewed.  Constitutional:      Appearance: Normal appearance.  Abdominal:     General: Abdomen is flat.     Palpations: Abdomen is soft.     Tenderness: There is left CVA tenderness.  Neurological:     Mental Status: He is alert.     Lab Results:  PSA No results found for: "PSA" No results found for: "TESTOSTERONE"  UA has >30 RBC's.   Studies/Results: Ultrasound renal complete  Result Date: 04/18/2023 CLINICAL DATA:  Follow-up nephrolithiasis. EXAM: RENAL / URINARY TRACT ULTRASOUND COMPLETE COMPARISON:  CT abdomen and pelvis Feb 12, 2023 FINDINGS: Right Kidney: Renal measurements: 12.1 x 5.9 x 5.5 cm = volume: 202.36 mL. Echogenicity within normal limits. No hydronephrosis visualized. There is a 0.7 x 0.4 x 0.7 cm simple cyst in right kidney. No follow-up is recommended. Left Kidney: Renal measurements: 11.8 x 6.2 x 5.4 cm = volume: 205.85 mL. Echogenicity within normal limits. No mass or hydronephrosis visualized. Bladder: Appears normal for degree of bladder distention. Bilateral ureteral jets are noted. Other: None. IMPRESSION: 1. No acute  abnormality identified. No renal calculi are identified. No hydronephrosis. Electronically Signed   By: Sherian Rein M.D.   On: 04/18/2023 11:15   Results for orders placed during the hospital encounter of 06/18/22  DG Abd 1 View  Narrative CLINICAL DATA:  Post ESWL; burning with urination  EXAM: ABDOMEN - 1 VIEW  COMPARISON:  Radiographs 06/05/2022  FINDINGS: The previously seen 7 mm calculus at the left ureteropelvic junction is no longer visualized. No new radiopaque calculi. Nonobstructive bowel-gas pattern. Large colonic stool burden.  IMPRESSION: The previous 7 mm calculus at the left UPJ is no longer visualized. No  new radiopaque calculi.  Large colonic stool burden.   Electronically Signed By: Minerva Fester M.D. On: 06/19/2022 01:53  No results found for this or any previous visit.  No results found for this or any previous visit.  No results found for this or any previous visit.  Results for orders placed during the hospital encounter of 04/18/23  Ultrasound renal complete  Narrative CLINICAL DATA:  Follow-up nephrolithiasis.  EXAM: RENAL / URINARY TRACT ULTRASOUND COMPLETE  COMPARISON:  CT abdomen and pelvis Feb 12, 2023  FINDINGS: Right Kidney:  Renal measurements: 12.1 x 5.9 x 5.5 cm = volume: 202.36 mL. Echogenicity within normal limits. No hydronephrosis visualized. There is a 0.7 x 0.4 x 0.7 cm simple cyst in right kidney. No follow-up is recommended.  Left Kidney:  Renal measurements: 11.8 x 6.2 x 5.4 cm = volume: 205.85 mL. Echogenicity within normal limits. No mass or hydronephrosis visualized.  Bladder:  Appears normal for degree of bladder distention. Bilateral ureteral jets are noted.  Other:  None.  IMPRESSION: 1. No acute abnormality identified. No renal calculi are identified. No hydronephrosis.   Electronically Signed By: Sherian Rein M.D. On: 04/18/2023 11:15  No valid procedures specified. No results found  for this or any previous visit.  No results found for this or any previous visit.  KUB reviewed and there is a 3-42mm left UVJ stone.   Assessment & Plan:  3-68mm LUVJ stone with pain, urgency and hematuria.   He should be able to pass this stone.  I have given him tamsulosin and a strainer.  He declined pain med.   He will return in 2 weeks with a KUB.   Meds ordered this encounter  Medications   tamsulosin (FLOMAX) 0.4 MG CAPS capsule    Sig: Take 1 capsule (0.4 mg total) by mouth daily.    Dispense:  30 capsule    Refill:  1    He has tolerated it in the past     Orders Placed This Encounter  Procedures   Microscopic Examination   DG Abd 1 View    Standing Status:   Future    Standing Expiration Date:   10/19/2023    Order Specific Question:   Reason for Exam (SYMPTOM  OR DIAGNOSIS REQUIRED)    Answer:   left ureteral stone    Order Specific Question:   Preferred imaging location?    Answer:   [facility]    Order Specific Question:   Radiology Contrast Protocol - do NOT remove file path    Answer:   [email or web address]   Urinalysis, Routine w reflex microscopic   BLADDER SCAN AMB NON-IMAGING      Return in about 2 weeks (around 05/02/2023) for sarah with a KUB.   CC: Suzan Slick, MD      Nathaniel Huff 04/19/2023

## 2023-04-19 ENCOUNTER — Encounter: Payer: Self-pay | Admitting: Urology

## 2023-04-19 LAB — URINALYSIS, ROUTINE W REFLEX MICROSCOPIC
Bilirubin, UA: NEGATIVE
Glucose, UA: NEGATIVE
Leukocytes,UA: NEGATIVE
Nitrite, UA: NEGATIVE
Specific Gravity, UA: 1.025 (ref 1.005–1.030)
Urobilinogen, Ur: 0.2 mg/dL (ref 0.2–1.0)
pH, UA: 5.5 (ref 5.0–7.5)

## 2023-04-19 LAB — MICROSCOPIC EXAMINATION
Bacteria, UA: NONE SEEN
RBC, Urine: >30 /hpf — AB (ref 0–2)

## 2023-04-25 ENCOUNTER — Ambulatory Visit: Payer: Self-pay | Admitting: Urology

## 2023-05-08 ENCOUNTER — Ambulatory Visit: Payer: Medicare Other | Admitting: Urology

## 2023-05-21 ENCOUNTER — Ambulatory Visit: Payer: Medicare Other | Admitting: Urology

## 2023-05-30 ENCOUNTER — Ambulatory Visit (INDEPENDENT_AMBULATORY_CARE_PROVIDER_SITE_OTHER): Payer: Medicare Other | Admitting: Urology

## 2023-05-30 ENCOUNTER — Ambulatory Visit (HOSPITAL_COMMUNITY)
Admission: RE | Admit: 2023-05-30 | Discharge: 2023-05-30 | Disposition: A | Discharge status: Home / Self Care | Payer: Medicare Other | Source: Ambulatory Visit | Attending: Urology | Admitting: Urology

## 2023-05-30 ENCOUNTER — Encounter: Payer: Self-pay | Admitting: Urology

## 2023-05-30 VITALS — BP 151/83 | HR 51 | Temp 97.6°F

## 2023-05-30 DIAGNOSIS — N201 Calculus of ureter: Secondary | ICD-10-CM

## 2023-05-30 DIAGNOSIS — N200 Calculus of kidney: Secondary | ICD-10-CM | POA: Diagnosis not present

## 2023-05-30 DIAGNOSIS — R109 Unspecified abdominal pain: Secondary | ICD-10-CM | POA: Diagnosis not present

## 2023-05-30 DIAGNOSIS — R972 Elevated prostate specific antigen [PSA]: Secondary | ICD-10-CM | POA: Diagnosis not present

## 2023-05-30 DIAGNOSIS — Z125 Encounter for screening for malignant neoplasm of prostate: Secondary | ICD-10-CM

## 2023-05-30 DIAGNOSIS — Z87898 Personal history of other specified conditions: Secondary | ICD-10-CM

## 2023-05-30 LAB — URINALYSIS, ROUTINE W REFLEX MICROSCOPIC
Bilirubin, UA: NEGATIVE
Glucose, UA: NEGATIVE
Ketones, UA: NEGATIVE
Leukocytes,UA: NEGATIVE
Nitrite, UA: NEGATIVE
Protein,UA: NEGATIVE
RBC, UA: NEGATIVE
Specific Gravity, UA: 1.015 (ref 1.005–1.030)
Urobilinogen, Ur: 0.2 mg/dL (ref 0.2–1.0)
pH, UA: 6 (ref 5.0–7.5)

## 2023-05-30 NOTE — Progress Notes (Signed)
Name: AKAI WEATHERBY DOB: 05-11-1966 MRN: [id number]  History of Present Illness: Mr. Krumm is a 57 y.o. male who presents today for follow up visit at [facility]. He is accompanied by his brother [place]. - GU History: 1. Kidney stones. - 06/05/2022: Underwent left ESWL by Dr. Pete Glatter. 2. Elevated PSA. - Per CT abdomen/pelvis w/o contrast on 02/12/2023: "The prostate and seminal vesicles are unremarkable."  PSA values: - 02/06/2018: 4.9 - 02/11/2019: 5.9 - 04/25/2021: 7.2 - 04/26/2022: 5.1  At last visit with Dr. Annabell Howells on 04/18/2023: - Seen for left flank pain x2 days with dysuria, increased frequency, urgency, and episode of gross hematuria 2 days prior. Had been seen at urgent care and was prescribed Keflex for possible UTI.  - Per visit note: Renal US and KUB showed a 3-4 mm left UVJ stone with no hydronephrosis. - The plan was Flomax for medical expulsive therapy x2 weeks.   Since last visit: 04/24/2023: Seen in ER for left flank pain. UA positive for 25 RBC/hpf with no evidence of UTI. Advised to take pain meds which he has at home PRN.  Today: KUB today: Awaiting radiology read; no stones appreciated.  He denies flank pain / abdominal pain. He denies fevers. He denies nausea/ vomiting.  He denies increased urinary urgency, frequency, nocturia, dysuria, gross hematuria, hesitancy, straining to void, or sensations of incomplete emptying.   Fall Screening: Do you usually have a device to assist in your mobility? No   Medications: Current Outpatient Medications  Medication Sig Dispense Refill   atorvastatin (LIPITOR) 10 MG tablet Take 10 mg by mouth daily.     atorvastatin (LIPITOR) 10 MG tablet Take 1 tablet by mouth daily.     cephALEXin (KEFLEX) 500 MG capsule Take 500 mg by mouth 2 (two) times daily.     omeprazole (PRILOSEC) 20 MG capsule Take 20 mg by mouth daily.     tamsulosin (FLOMAX) 0.4 MG CAPS capsule Take 1 capsule (0.4 mg total) by mouth  daily. 30 capsule 1   No current facility-administered medications for this visit.    Allergies: Allergies  Allergen Reactions   Bee Venom    Pravastatin     Other reaction(s): Muscle Pain   Diphenhydramine Hcl Nausea Only   Sulfamethoxazole-Trimethoprim Rash    Past Medical History:  Diagnosis Date   Mentally challenged    slightly   Past Surgical History:  Procedure Laterality Date   CATARACT EXTRACTION W/PHACO Left 01/05/2022   Procedure: CATARACT EXTRACTION PHACO AND INTRAOCULAR LENS PLACEMENT (IOC);  Surgeon: Fabio Pierce, MD;  Location: [facility];  Service: Ophthalmology;  Laterality: Left;  ODE: 3.79   CATARACT EXTRACTION W/PHACO Right 01/15/2022   Procedure: CATARACT EXTRACTION PHACO AND INTRAOCULAR LENS PLACEMENT (IOC);  Surgeon: Fabio Pierce, MD;  Location: [facility];  Service: Ophthalmology;  Laterality: Right;  CDE: 2.72    EXTRACORPOREAL SHOCK WAVE LITHOTRIPSY Left 06/05/2022   Procedure: EXTRACORPOREAL SHOCK WAVE LITHOTRIPSY (ESWL);  Surgeon: Milderd Meager., MD;  Location: [facility];  Service: Urology;  Laterality: Left;   History reviewed. No pertinent family history. Social History   Socioeconomic History   Marital status: Single    Spouse name: Not on file   Number of children: Not on file   Years of education: Not on file   Highest education level: Not on file  Occupational History   Not on file  Tobacco Use   Smoking status: Never   Smokeless tobacco: Never  Vaping Use  Vaping status: Never Used  Substance and Sexual Activity   Alcohol use: Not Currently   Drug use: Not Currently   Sexual activity: Not Currently  Other Topics Concern   Not on file  Social History Narrative   Not on file   Social Determinants of Health   Financial Resource Strain: Low Risk  (05/01/2023)   Received from [facility]   Overall Financial Resource Strain (CARDIA)    Difficulty of Paying Living Expenses: Not very hard  Food Insecurity: No Food Insecurity  (02/20/2023)   Received from [facility], [facility]   Hunger Vital Sign    Worried About Running Out of Food in the Last Year: Never true    Ran Out of Food in the Last Year: Never true  Transportation Needs: No Transportation Needs (04/26/2022)   Received from [facility] - Transportation  Physical Activity: Sufficiently Active (04/26/2022)   Received from [facility], [facility]   Exercise Vital Sign    Days of Exercise per Week: 5 days    Minutes of Exercise per Session: 30 min  Stress: No Stress Concern Present (04/26/2022)   Received from [facility] - Occupational Stress Questionnaire  Social Connections: Moderately Integrated (04/26/2022)   Received from [facility]   Social Connection and Isolation Panel [NHANES]  Intimate Partner Violence: Not At Risk (05/01/2023)   Received from [facility]   Humiliation, Afraid, Rape, and Kick questionnaire    Fear of Current or Ex-Partner: No    Emotionally Abused: No    Physically Abused: No    Sexually Abused: No    SUBJECTIVE  Review of Systems Constitutional: Patient denies any unintentional weight loss or change in strength lntegumentary: Patient denies any rashes or pruritus Cardiovascular: Patient denies chest pain or syncope Respiratory: Patient denies shortness of breath Gastrointestinal: Patient denies nausea, vomiting, constipation, or diarrhea Musculoskeletal: Patient denies muscle cramps or weakness Neurologic: Patient denies convulsions or seizures Psychiatric: Patient denies memory problems Allergic/Immunologic: Patient denies recent allergic reaction(s) Hematologic/Lymphatic: Patient denies bleeding tendencies Endocrine: Patient denies heat/cold intolerance  GU: As per HPI.  OBJECTIVE Vitals:   05/30/23 1137  BP: (!) 151/83  Pulse: (!) 51  Temp: 97.6 F (36.4 C)   There is no height or weight on file to calculate  BMI.  Physical Examination Constitutional: No obvious distress; patient is non-toxic appearing  Cardiovascular: No visible lower extremity edema.  Respiratory: The patient does not have audible wheezing/stridor; respirations do not appear labored  Gastrointestinal: Abdomen non-distended Musculoskeletal: Normal ROM of UEs  Skin: No obvious rashes/open sores  Neurologic: CN 2-12 grossly intact Psychiatric: Answered questions appropriately with normal affect  Hematologic/Lymphatic/Immunologic: No obvious bruises or sites of spontaneous bleeding  UA: negative   ASSESSMENT Left ureteral stone - Plan: Urinalysis, Routine w reflex microscopic  Left flank pain - Plan: Urinalysis, Routine w reflex microscopic  Nephrolithiasis - Plan: Urinalysis, Routine w reflex microscopic, DG Abd 1 View  History of elevated PSA  Prostate cancer screening  We reviewed recent imaging results; awaiting radiology results, appears to have no acute findings.  For stone prevention: Advised adequate hydration and we discussed option to consider low oxalate diet given that calcium oxalate is the most common type of stone. Handout provided about stone prevention diet.  Will plan to follow up in 6 months with KUB for stone surveillance or sooner if needed. Pt verbalized  understanding and agreement. All questions were answered.  PLAN Advised the following: Maintain adequate fluid intake. Low oxalate diet. Return in about 6 months (around 11/30/2023) for KUB, UA, & f/u with Evette Georges NP.  Orders Placed This Encounter  Procedures   DG Abd 1 View    Standing Status:   Future    Standing Expiration Date:   05/29/2024    Order Specific Question:   Reason for Exam (SYMPTOM  OR DIAGNOSIS REQUIRED)    Answer:   kidney stone    Order Specific Question:   Preferred imaging location?    Answer:   [facility]   Urinalysis, Routine w reflex microscopic   It has been explained that the patient is to follow  regularly with their PCP in addition to all other providers involved in their care and to follow instructions provided by these respective offices. Patient advised to contact urology clinic if any urologic-pertaining questions, concerns, new symptoms or problems arise in the interim period.  Patient Instructions  >80% of stones are calcium oxalate. This type of stones forms when body either isn't clearing oxalate well enough, is making too much oxalate, or too little citrate. This results in oxalate binding to form crystals, which continue to aggregate and form stones.  Limiting calcium does not help, but limiting oxalate in the diet can help. Increasing citric acid intake may also help.  The following measures may help to prevent the recurrence of stones: Increase water intake to 2-2.5 liters per day May add citrus juice (lemon, lime or orange juice) to water Moderation in dairy foods Decrease in salt content Low Oxalate diet: Oxylates are found in foods like Tomato, Spinach, red wine and chocolate (see additional resources below).  Internet resources for information regarding low oxalate diet:  https://kidneystones.yangchunwu.com https://my.VerticalStretch.be  Foods Low in Sodium or Oxalate Foods You Can Eat  Drinks Coffee, fruit and veggie juice (using the recommended veggies), fruit punch  Fruits Apples, apricots (fresh or canned), avocado, bananas, cherries (sweet), cranberries, grapefruit, red or green grapes, lemon and lime juice, melons, nectarines, papayas, peaches, pears, pineapples, oranges, strawberries (fresh), tangerines  Veggies Artichokes, asparagus, bamboo shoots, broccoli, brussels sprouts, cabbage, cauliflower, chayote squash, chicory, corn, cucumbers, endive, lettuce, lima beans, mushrooms, onions, peas, peppers, potatoes, radishes, rutabagas, zucchini  Breads, Cereals, Grains Egg  noodles, rye bread, cooked and dry cereals without nuts or bran, crackers with unsalted tops, white or wild rice  Meat, Meat Replacements, Fish, Recruitment consultant, fish, poultry, eggs, egg whites, egg replacements  Soup Homemade soup (using the recommended veggies and meat), low-sodium bouillon, low-sodium canned  Desserts Cookies, cakes, ice cream, pudding without chocolate or nuts, candy without chocolate or nuts  Fats and Oils Butter, margarine, cream, oil, salad dressing, mayo  Other Foods Unsalted potato chips or pretzels, herbs (like garlic, garlic powder, onion powder), lemon juice, salt-free seasoning blends, vinegar  Other Foods Low in Oxalate Foods You Can Eat  Drinks Beer, cola, wine, buttermilk, lemonade or limeade (without added vitamin C), milk  Meat, Meat Replacements, Fish, [employer] meat, ham, bacon, hot dogs, bratwurst, sausage, chicken nuggets, cheddar cheese, canned fish and shellfish  Soup Tomato soup, cheese soup  Other Foods Coconuts, lemon or lime juices, sugar or sweeteners, jellies or jams (from the recommended list)   Moderate-Oxalate Foods Foods to Limit   Drinks Fruit and veggie juices (from the list below), chocolate milk, rice milk, hot cocoa, tea   Fruits Blackberries, blueberries, black currants, cherries (sour), fruit cocktail,  mangoes, orange peel, prunes, purple plums   Veggies Baked beans, carrots, celery, green beans, parsnips, summer squash, tomatoes, turnips   Breads, Cereals, Grains White bread, cornbread or cornmeal, white English muffins, saltine or soda crackers, brown rice, vanilla wafers, spaghetti and other noodles, firm tofu, bagels, oatmeal   Meat/meat replacements, fish, poultry Sardines   Desserts Chocolate cake   Fats and Oils Macadamia nuts, pistachio nuts, English walnuts   Other Foods Jams or jellies (made with the fruits above), pepper   High-Oxalate Foods Foods to Avoid Drinks Chocolate drink mixes, soy milk, Ovaltine, instant iced tea,  fruit juices of fruits listed below Fruits Apricots (dried), red currants, figs, kiwi, plums, rhubarb Veggies Beans (wax, dried), beets and beet greens, chives, collard greens, eggplant, escarole, dark greens of all kinds, leeks, okra, parsley, rutabagas, spinach, Swiss chard, tomato paste, watercress Breads, Cereals, Grains Amaranth, barley, white corn flour, fried potatoes, fruitcake, grits, soybean products, sweet potatoes, wheat germ and bran, buckwheat flour, All Bran cereal, graham crackers, pretzels, whole wheat bread Meat/meat replacements, fish, poultry Dried beans, peanut butter, soy burgers, miso Desserts Carob, chocolate, marmalades Fats and Oils Nuts (peanuts, almonds, pecans, cashews, hazelnuts), nut butters, sesame seeds, tahini paste Other Foods Poppy seeds   Electronically signed by:  Donnita Falls, MSN, FNP-C, CUNP 05/30/2023 11:49 AM

## 2023-05-30 NOTE — Patient Instructions (Signed)

## 2023-10-24 ENCOUNTER — Ambulatory Visit (INDEPENDENT_AMBULATORY_CARE_PROVIDER_SITE_OTHER): Payer: Self-pay | Admitting: Urology

## 2023-10-24 ENCOUNTER — Ambulatory Visit (HOSPITAL_COMMUNITY)
Admission: RE | Admit: 2023-10-24 | Discharge: 2023-10-24 | Disposition: A | Discharge status: Home / Self Care | Payer: Medicare Other | Source: Ambulatory Visit | Attending: Urology | Admitting: Urology

## 2023-10-24 ENCOUNTER — Ambulatory Visit: Payer: Medicare Other | Admitting: Urology

## 2023-10-24 VITALS — BP 146/89 | HR 67

## 2023-10-24 DIAGNOSIS — Z87898 Personal history of other specified conditions: Secondary | ICD-10-CM | POA: Diagnosis not present

## 2023-10-24 DIAGNOSIS — R109 Unspecified abdominal pain: Secondary | ICD-10-CM | POA: Diagnosis not present

## 2023-10-24 DIAGNOSIS — N201 Calculus of ureter: Secondary | ICD-10-CM | POA: Diagnosis present

## 2023-10-24 DIAGNOSIS — N200 Calculus of kidney: Secondary | ICD-10-CM

## 2023-10-24 DIAGNOSIS — R972 Elevated prostate specific antigen [PSA]: Secondary | ICD-10-CM | POA: Diagnosis not present

## 2023-10-24 NOTE — Progress Notes (Signed)
Subjective:  1. Ureteral stone   2. Flank pain   3. Kidney stone   4. History of elevated PSA   5. Elevated prostate specific antigen (PSA)    10/24/23: Nathaniel Huff returns today in f/u with a several day history of some back pain that is worse on the left.  He has no nausea or hematuria. His last CT in 5/24 showed small bilateral renal stones and the KUB in the fall was negative.  His UA is clear.   HE has a history of an elevated PSA and I will repeat the PSA prior to his next f/u.  His exam is benign today.  He is on tamsulosin from the urgent care.     04/18/23: [name] returns today with a 2 day history of left flank pain with dysuria.  He has increased frequency and urgency.  HIs UA has 30 RBC's and he had some gross hematuria 2 days ago and went to urgent care and got a couple of meds. He got keflex for a possible infection.  He had a renal US had KUB today that showed no hydronephrosis but there was a 3-73mm LUVJ stone which was probably in the LLP on CT in May.     ROS:  ROS:  A complete review of systems was performed.  All systems are negative except for pertinent findings as noted.   Review of Systems  All other systems reviewed and are negative.   Allergies  Allergen Reactions   Bee Venom    Pravastatin     Other reaction(s): Muscle Pain   Diphenhydramine Hcl Nausea Only   Sulfamethoxazole-Trimethoprim Rash    Outpatient Encounter Medications as of 10/24/2023  Medication Sig   atorvastatin (LIPITOR) 10 MG tablet Take 10 mg by mouth daily.   atorvastatin (LIPITOR) 10 MG tablet Take 1 tablet by mouth daily.   cephALEXin (KEFLEX) 500 MG capsule Take 500 mg by mouth 2 (two) times daily.   omeprazole (PRILOSEC) 20 MG capsule Take 20 mg by mouth daily.   tamsulosin (FLOMAX) 0.4 MG CAPS capsule Take 1 capsule (0.4 mg total) by mouth daily.   No facility-administered encounter medications on file as of 10/24/2023.    Past Medical History:  Diagnosis Date   Mentally challenged     slightly    Past Surgical History:  Procedure Laterality Date   CATARACT EXTRACTION W/PHACO Left 01/05/2022   Procedure: CATARACT EXTRACTION PHACO AND INTRAOCULAR LENS PLACEMENT (IOC);  Surgeon: Fabio Pierce, MD;  Location: [facility];  Service: Ophthalmology;  Laterality: Left;  ODE: 3.79   CATARACT EXTRACTION W/PHACO Right 01/15/2022   Procedure: CATARACT EXTRACTION PHACO AND INTRAOCULAR LENS PLACEMENT (IOC);  Surgeon: Fabio Pierce, MD;  Location: [facility];  Service: Ophthalmology;  Laterality: Right;  CDE: 2.72    EXTRACORPOREAL SHOCK WAVE LITHOTRIPSY Left 06/05/2022   Procedure: EXTRACORPOREAL SHOCK WAVE LITHOTRIPSY (ESWL);  Surgeon: Milderd Meager., MD;  Location: [facility];  Service: Urology;  Laterality: Left;    Social History   Socioeconomic History   Marital status: Single    Spouse name: Not on file   Number of children: Not on file   Years of education: Not on file   Highest education level: Not on file  Occupational History   Not on file  Tobacco Use   Smoking status: Never   Smokeless tobacco: Never  Vaping Use   Vaping status: Never Used  Substance and Sexual Activity   Alcohol use: Not Currently   Drug use: Not  Currently   Sexual activity: Not Currently  Other Topics Concern   Not on file  Social History Narrative   Not on file   Social Drivers of Health   Financial Resource Strain: Low Risk  (06/05/2023)   Received [facility]   Overall Financial Resource Strain (CARDIA)    Difficulty of Paying Living Expenses: Not hard at all  Food Insecurity: No Food Insecurity (06/05/2023)   Received from [facility]   Hunger Vital Sign    Worried About Running Out of Food in the Last Year: Never true    Ran Out of Food in the Last Year: Never true  Transportation Needs: No Transportation Needs (06/05/2023)   Received from [facility] - [facility]    Lack of Transportation (Medical): No    Lack of Transportation (Non-Medical): No   Physical Activity: Sufficiently Active (06/05/2023)   Received from [facility]   Exercise Vital Sign    Days of Exercise per Week: 7 days    Minutes of Exercise per Session: 30 min  Stress: No Stress Concern Present (06/05/2023)   Received from [facility] - Occupational Stress Questionnaire    Feeling of Stress : Not at all  Social Connections: Moderately Integrated (06/05/2023)   Received from [facility]   Social Connection and Isolation Panel [NHANES]    Frequency of Communication with Friends and Family: Three times a week    Frequency of Social Gatherings with Friends and Family: Twice a week    Attends Religious Services: 1 to 4 times per year    Active Member of [employer] or Organizations: No    Attends Banker Meetings: 1 to 4 times per year    Marital Status: Never married  Intimate Partner Violence: Not At Risk (06/05/2023)   Received from [facility]   Humiliation, Afraid, Rape, and Kick questionnaire    Fear of Current or Ex-Partner: No    Emotionally Abused: No    Physically Abused: No    Sexually Abused: No    No family history on file.     Objective: There were no vitals filed for this visit.    Physical Exam Vitals reviewed.  Constitutional:      Appearance: Normal appearance.  Abdominal:     General: Abdomen is flat.     Palpations: Abdomen is soft.     Tenderness: There is left CVA tenderness.  Genitourinary:    Comments: Prostate is 2+ and benign.  Neurological:     Mental Status: He is alert.     Lab Results:  PSA No results found for: "PSA" No results found for: "TESTOSTERONE"  UA has >30 RBC's.   Studies/Results: DG Abd 1 View Result Date: 10/24/2023 CLINICAL DATA:  Flank pain for several weeks. EXAM: ABDOMEN - 1 VIEW COMPARISON:  May 30, 2023. FINDINGS: The bowel gas pattern is normal. No radio-opaque calculi or other significant radiographic abnormality are seen.  IMPRESSION: Negative. Electronically Signed   By: Lupita Raider M.D.   On: 10/24/2023 16:45   Results for orders placed during the hospital encounter of 06/18/22  DG Abd 1 View  Narrative CLINICAL DATA:  Post ESWL; burning with urination  EXAM: ABDOMEN - 1 VIEW  COMPARISON:  Radiographs 06/05/2022  FINDINGS: The previously seen 7 mm calculus at the left ureteropelvic junction is no longer visualized. No new radiopaque calculi. Nonobstructive bowel-gas pattern.  Large colonic stool burden.  IMPRESSION: The previous 7 mm calculus at the left UPJ is no longer visualized. No new radiopaque calculi.  Large colonic stool burden.   Electronically Signed By: Minerva Fester M.D. On: 06/19/2022 01:53  No results found for this or any previous visit.  No results found for this or any previous visit.  No results found for this or any previous visit.  Results for orders placed during the hospital encounter of 04/18/23  Ultrasound renal complete  Narrative CLINICAL DATA:  Follow-up nephrolithiasis.  EXAM: RENAL / URINARY TRACT ULTRASOUND COMPLETE  COMPARISON:  CT abdomen and pelvis Feb 12, 2023  FINDINGS: Right Kidney:  Renal measurements: 12.1 x 5.9 x 5.5 cm = volume: 202.36 mL. Echogenicity within normal limits. No hydronephrosis visualized. There is a 0.7 x 0.4 x 0.7 cm simple cyst in right kidney. No follow-up is recommended.  Left Kidney:  Renal measurements: 11.8 x 6.2 x 5.4 cm = volume: 205.85 mL. Echogenicity within normal limits. No mass or hydronephrosis visualized.  Bladder:  Appears normal for degree of bladder distention. Bilateral ureteral jets are noted.  Other:  None.  IMPRESSION: 1. No acute abnormality identified. No renal calculi are identified. No hydronephrosis.   Electronically Signed By: Sherian Rein M.D. On: 04/18/2023 11:15  No valid procedures specified. No results found for this or any previous visit.  No results found  for this or any previous visit.  KUB reviewed and there is a 3-70mm left UVJ stone.   Assessment & Plan:  Flank pain with possible ureteral stone.   I will get a KUB today and have him keep f/u in February.  If the KUB is negative and his symptoms worsen we could consider a CT.  I think his pain is probably musculoskeletal and encouraged him to use OTC pain med.  He will continue the flomax.   Elevated PSA.  He will need a PSA prior to f/u.   No orders of the defined types were placed in this encounter.    Orders Placed This Encounter  Procedures   DG Abd 1 View    Standing Status:   Future    Number of Occurrences:   1    Expected Date:   10/24/2023    Expiration Date:   01/22/2024    Reason for Exam (SYMPTOM  OR DIAGNOSIS REQUIRED):   flank pain with stones    Preferred imaging location?:   [facility]    Radiology Contrast Protocol - do NOT remove file path:   [email or web address]   PSA, total and free    Standing Status:   Future    Expected Date:   11/24/2023    Expiration Date:   01/22/2024      Return for keep scheduled f/u. Marland Kitchen   CC: Lindaann Slough, DO      Bjorn Pippin 10/25/2023

## 2023-10-25 ENCOUNTER — Telehealth: Payer: Self-pay

## 2023-10-25 ENCOUNTER — Encounter: Payer: Self-pay | Admitting: Urology

## 2023-10-25 NOTE — Telephone Encounter (Signed)
-----   Message from Bjorn Pippin sent at 10/25/2023  8:45 AM EST ----- The xray shows no stones.  His pain is most likely musculoskeletal. ----- Message ----- From: Sarajane Jews, CMA Sent: 10/25/2023   8:01 AM EST To: Bjorn Pippin, MD  Please review

## 2023-10-25 NOTE — Telephone Encounter (Signed)
Called Pt to relay message from MD we got no answer LVM to call us back

## 2023-10-28 ENCOUNTER — Encounter: Payer: Self-pay | Admitting: Urology

## 2023-11-22 NOTE — Progress Notes (Deleted)
 Name: Nathaniel Huff DOB: 06-14-1966 MRN: [id number]  History of Present Illness: Nathaniel Huff is a 58 y.o. male who presents today for follow up visit at [facility]. ***He is accompanied by ***. - GU History: 1. Kidney stones. - 06/05/2022: Underwent left ESWL by Dr. Pete Glatter. 2. Elevated PSA. - Per CT abdomen/pelvis w/o contrast on 02/12/2023: "The prostate and seminal vesicles are unremarkable."   PSA values: - 02/06/2018: 4.9 - 02/11/2019: 5.9 - 04/25/2021: 7.2 - 04/26/2022: 5.1  At last visit with Dr. Annabell Howells on 10/24/2023: - Reported flank pain. - KUB negative for stones.   Since last visit: > ***/2025: PSA = ***  Today: He {Actions; denies-reports:120008} flank pain or abdominal pain. He {Actions; denies-reports:120008} fevers, nausea, or vomiting.  He {Actions; denies-reports:120008} increased urinary urgency, frequency, nocturia, dysuria, gross hematuria, hesitancy, straining to void, or sensations of incomplete emptying.  ***If still symptomatic for possible stone, order CT stone since KUB was negative   Fall Screening: Do you usually have a device to assist in your mobility? {yes/no:20286}  ***cane / ***walker / ***wheelchair  Medications: Current Outpatient Medications  Medication Sig Dispense Refill   atorvastatin (LIPITOR) 10 MG tablet Take 10 mg by mouth daily.     atorvastatin (LIPITOR) 10 MG tablet Take 1 tablet by mouth daily.     cephALEXin (KEFLEX) 500 MG capsule Take 500 mg by mouth 2 (two) times daily.     omeprazole (PRILOSEC) 20 MG capsule Take 20 mg by mouth daily.     tamsulosin (FLOMAX) 0.4 MG CAPS capsule Take 1 capsule (0.4 mg total) by mouth daily. 30 capsule 1   No current facility-administered medications for this visit.    Allergies: Allergies  Allergen Reactions   Bee Venom    Pravastatin     Other reaction(s): Muscle Pain   Diphenhydramine Hcl Nausea Only   Sulfamethoxazole-Trimethoprim Rash    Past Medical  History:  Diagnosis Date   GERD (gastroesophageal reflux disease)    Hypercholesteremia    Kidney stones    Mentally challenged    slightly   Past Surgical History:  Procedure Laterality Date   CATARACT EXTRACTION W/PHACO Left 01/05/2022   Procedure: CATARACT EXTRACTION PHACO AND INTRAOCULAR LENS PLACEMENT (IOC);  Surgeon: Fabio Pierce, MD;  Location: [facility];  Service: Ophthalmology;  Laterality: Left;  ODE: 3.79   CATARACT EXTRACTION W/PHACO Right 01/15/2022   Procedure: CATARACT EXTRACTION PHACO AND INTRAOCULAR LENS PLACEMENT (IOC);  Surgeon: Fabio Pierce, MD;  Location: [facility];  Service: Ophthalmology;  Laterality: Right;  CDE: 2.72    EXTRACORPOREAL SHOCK WAVE LITHOTRIPSY Left 06/05/2022   Procedure: EXTRACORPOREAL SHOCK WAVE LITHOTRIPSY (ESWL);  Surgeon: Milderd Meager., MD;  Location: [facility];  Service: Urology;  Laterality: Left;   No family history on file. Social History   Socioeconomic History   Marital status: Single    Spouse name: Not on file   Number of children: Not on file   Years of education: Not on file   Highest education level: Not on file  Occupational History   Not on file  Tobacco Use   Smoking status: Never   Smokeless tobacco: Never  Vaping Use   Vaping status: Never Used  Substance and Sexual Activity   Alcohol use: Not Currently   Drug use: Not Currently   Sexual activity: Not Currently  Other Topics Concern   Not on file  Social History Narrative   ** Merged History Encounter **  Social Drivers of Corporate investment banker Strain: Low Risk  (06/05/2023)   [facility]   Overall Financial Resource Strain (CARDIA)    Difficulty of Paying Living Expenses: Not hard at all  Food Insecurity: No Food Insecurity (06/05/2023)   Received from [facility]   Hunger Vital Sign    Worried About Running Out of Food in the Last Year: Never true    Ran Out of Food in the Last Year: Never true  Transportation Needs: No  Transportation Needs (06/05/2023)   Received from [facility] - Transportation    Lack of Transportation (Medical): No    Lack of Transportation (Non-Medical): No  Physical Activity: Sufficiently Active (06/05/2023)   Received from [facility]   Exercise Vital Sign    Days of Exercise per Week: 7 days    Minutes of Exercise per Session: 30 min  Stress: No Stress Concern Present (06/05/2023)   Received from [facility] - Occupational Stress Questionnaire    Feeling of Stress : Not at all  Social Connections: Moderately Integrated (06/05/2023)   Received from [facility]   Social Connection and Isolation Panel [NHANES]    Frequency of Communication with Friends and Family: Three times a week    Frequency of Social Gatherings with Friends and Family: Twice a week    Attends Religious Services: 1 to 4 times per year    Active Member of [employer] or Organizations: No    Attends Engineer, structural: 1 to 4 times per year    Marital Status: Never married  Intimate Partner Violence: Not At Risk (06/05/2023)   Received from [facility]   Humiliation, Afraid, Rape, and Kick questionnaire    Fear of Current or Ex-Partner: No    Emotionally Abused: No    Physically Abused: No    Sexually Abused: No    SUBJECTIVE  Review of Systems Constitutional: Patient denies any unintentional weight loss or change in strength lntegumentary: Patient denies any rashes or pruritus Cardiovascular: Patient denies chest pain or syncope Respiratory: Patient denies shortness of breath Gastrointestinal: Patient ***denies nausea, vomiting, constipation, or diarrhea Musculoskeletal: Patient denies muscle cramps or weakness Neurologic: Patient denies convulsions or seizures Allergic/Immunologic: Patient denies recent allergic reaction(s) Hematologic/Lymphatic: Patient denies bleeding tendencies Endocrine: Patient denies heat/cold  intolerance  GU: As per HPI.  OBJECTIVE There were no vitals filed for this visit. There is no height or weight on file to calculate BMI.  Physical Examination Constitutional: No obvious distress; patient is non-toxic appearing  Cardiovascular: No visible lower extremity edema.  Respiratory: The patient does not have audible wheezing/stridor; respirations do not appear labored  Gastrointestinal: Abdomen non-distended Musculoskeletal: Normal ROM of UEs  Skin: No obvious rashes/open sores  Neurologic: CN 2-12 grossly intact Psychiatric: Answered questions appropriately with normal affect  Hematologic/Lymphatic/Immunologic: No obvious bruises or sites of spontaneous bleeding  UA:  ***positive for *** leukocytes, *** blood, ***nitrites ***Urine microscopy:  ***negative  *** WBC/hpf, *** RBC/hpf, *** bacteria ***with no evidence of UTI ***with no evidence of microscopic hematuria ***otherwise unremarkable ***glucosuria (secondary to ***Jardiance ***Farxiga use)  PVR: *** ml  ASSESSMENT No diagnosis found.  ***We reviewed recent imaging results; ***awaiting radiology results, appears to have ***no acute findings per provider interpretation.  ***For stone prevention: Advised adequate hydration and we discussed option to consider low oxalate diet given that calcium oxalate is the most  common type of stone. Handout provided about stone prevention diet.  ***For recurrent stone formers: We discussed option to proceed with 24 hour urinalysis ([facility]) for metabolic stone evaluation, which may help with targeted recommendations for dietary I medication therapies for stone prevention. Patient elected to ***proceed/ ***hold off.  Will plan to follow up in ***6 months / ***1 year with ***KUB ***RUS for stone surveillance or sooner if needed.  Patient verbalized understanding of and agreement with current plan. All questions were answered.  PLAN Advised the following: Maintain adequate  fluid intake daily. Drink citrus juice (lemon, lime or orange juice) routinely. Low oxalate diet. No follow-ups on file.  No orders of the defined types were placed in this encounter.   It has been explained that the patient is to follow regularly with their PCP in addition to all other providers involved in their care and to follow instructions provided by these respective offices. Patient advised to contact urology clinic if any urologic-pertaining questions, concerns, new symptoms or problems arise in the interim period.  There are no Patient Instructions on file for this visit.  Electronically signed by:  Donnita Falls, MSN, FNP-C, CUNP 11/22/2023 3:21 PM

## 2023-11-25 ENCOUNTER — Telehealth: Payer: Self-pay

## 2023-11-25 NOTE — Telephone Encounter (Signed)
 Patient's brother Reita Cliche is made aware and voiced understanding.

## 2023-11-25 NOTE — Telephone Encounter (Signed)
-----   Message from Donnita Falls sent at 11/22/2023  3:24 PM EST ----- Please remind patient to do lab visit for PSA check at least 1 day prior to visit on Wednesday 11/27/2023. Thanks.

## 2023-11-26 ENCOUNTER — Other Ambulatory Visit: Payer: Medicare Other

## 2023-11-26 DIAGNOSIS — R972 Elevated prostate specific antigen [PSA]: Secondary | ICD-10-CM

## 2023-11-26 DIAGNOSIS — Z87898 Personal history of other specified conditions: Secondary | ICD-10-CM

## 2023-11-27 ENCOUNTER — Ambulatory Visit: Payer: Medicare Other | Admitting: Urology

## 2023-11-27 DIAGNOSIS — Z87898 Personal history of other specified conditions: Secondary | ICD-10-CM

## 2023-11-27 DIAGNOSIS — N200 Calculus of kidney: Secondary | ICD-10-CM

## 2023-11-27 LAB — PSA, TOTAL AND FREE
PSA, Free Pct: 12 %
PSA, Free: 0.72 ng/mL
Prostate Specific Ag, Serum: 6 ng/mL — ABNORMAL HIGH (ref 0.0–4.0)

## 2023-11-28 ENCOUNTER — Ambulatory Visit: Payer: Medicare Other | Admitting: Urology

## 2023-11-28 ENCOUNTER — Telehealth: Payer: Self-pay

## 2023-11-28 NOTE — Telephone Encounter (Signed)
-----   Message from Bjorn Pippin sent at 11/28/2023 10:06 AM EST ----- Regarding: PSA results. HIs PSA was 7.2 in 2022 and 5.1 in 2023 so this is fairly stable but is elevated.   Keep f/u with Maralyn Sago as planned. ----- Message ----- From: Sarajane Jews, CMA Sent: 11/27/2023   8:20 AM EST To: Bjorn Pippin, MD; Donnita Falls, FNP  Please review.

## 2023-11-28 NOTE — Telephone Encounter (Signed)
 Call Pt to relay message from MD Annabell Howells see prior telephone encounter for details

## 2023-12-09 NOTE — Progress Notes (Unsigned)
 Name: Nathaniel Huff DOB: 1966/06/11 MRN: [id number]  History of Present Illness: Nathaniel Huff is a 58 y.o. male who presents today for follow up visit at [facility] [place]. ***He is accompanied by ***. - GU History: 1. Kidney stones. - 06/05/2022: Underwent left ESWL by Dr. Pete Glatter. 2. Elevated PSA. - Per CT abdomen/pelvis w/o contrast on 02/12/2023: "The prostate and seminal vesicles are unremarkable."   PSA values: - 02/06/2018: 4.9 - 02/11/2019: 5.9 - 04/25/2021: 7.2 - 04/26/2022: 5.1  At last visit with Dr. Annabell Howells on 10/24/2023: - Reported flank pain. - KUB negative for stones.   Since last visit: > 11/26/2023: PSA = 6.0  ***Per Dr. Annabell Howells on 11/28/2023:  "This fellow has an elevated PSA that is fairly stable over the last 3 years but he has a 12% f/t ratio which is concerning...Marland Kitchenhe should probably have a prostate MRI done."  Today: He {Actions; denies-reports:120008} flank pain or abdominal pain. He {Actions; denies-reports:120008} fevers, nausea, or vomiting.  He {Actions; denies-reports:120008} urinary urgency, frequency, nocturia, dysuria, gross hematuria, ***weak urinary stream, hesitancy, straining to void, or sensations of incomplete emptying. Reports voiding ***x/day and ***x/night.  ***If still symptomatic for possible stone, order CT stone since KUB was negative   Fall Screening: Do you usually have a device to assist in your mobility? {yes/no:20286}  ***cane / ***walker / ***wheelchair  Medications: Current Outpatient Medications  Medication Sig Dispense Refill   atorvastatin (LIPITOR) 10 MG tablet Take 10 mg by mouth daily.     atorvastatin (LIPITOR) 10 MG tablet Take 1 tablet by mouth daily.     cephALEXin (KEFLEX) 500 MG capsule Take 500 mg by mouth 2 (two) times daily.     omeprazole (PRILOSEC) 20 MG capsule Take 20 mg by mouth daily.     tamsulosin (FLOMAX) 0.4 MG CAPS capsule Take 1 capsule (0.4 mg total) by mouth daily. 30 capsule 1   No  current facility-administered medications for this visit.    Allergies: Allergies  Allergen Reactions   Bee Venom    Pravastatin     Other reaction(s): Muscle Pain   Diphenhydramine Hcl Nausea Only   Sulfamethoxazole-Trimethoprim Rash    Past Medical History:  Diagnosis Date   GERD (gastroesophageal reflux disease)    Hypercholesteremia    Kidney stones    Mentally challenged    slightly   Past Surgical History:  Procedure Laterality Date   CATARACT EXTRACTION W/PHACO Left 01/05/2022   Procedure: CATARACT EXTRACTION PHACO AND INTRAOCULAR LENS PLACEMENT (IOC);  Surgeon: Fabio Pierce, MD;  Location: [facility];  Service: Ophthalmology;  Laterality: Left;  ODE: 3.79   CATARACT EXTRACTION W/PHACO Right 01/15/2022   Procedure: CATARACT EXTRACTION PHACO AND INTRAOCULAR LENS PLACEMENT (IOC);  Surgeon: Fabio Pierce, MD;  Location: [facility];  Service: Ophthalmology;  Laterality: Right;  CDE: 2.72    EXTRACORPOREAL SHOCK WAVE LITHOTRIPSY Left 06/05/2022   Procedure: EXTRACORPOREAL SHOCK WAVE LITHOTRIPSY (ESWL);  Surgeon: Milderd Meager., MD;  Location: [facility];  Service: Urology;  Laterality: Left;   No family history on file. Social History   Socioeconomic History   Marital status: Single    Spouse name: Not on file   Number of children: Not on file   Years of education: Not on file   Highest education level: Not on file  Occupational History   Not on file  Tobacco Use   Smoking status: Never   Smokeless tobacco: Never  Vaping Use   Vaping status: Never Used  Substance  and Sexual Activity   Alcohol use: Not Currently   Drug use: Not Currently   Sexual activity: Not Currently  Other Topics Concern   Not on file  Social History Narrative   ** Merged History Encounter **       Social Drivers of Health   Financial Resource Strain: Low Risk  (06/05/2023)   Received from [facility]   Overall Financial Resource Strain (CARDIA)    Difficulty of Paying Living Expenses:  Not hard at all  Food Insecurity: No Food Insecurity (06/05/2023)   Received from [facility]   Hunger Vital Sign    Worried About Running Out of Food in the Last Year: Never true    Ran Out of Food in the Last Year: Never true  Transportation Needs: No Transportation Needs (06/05/2023)   Received from [facility] - Transportation    Lack of Transportation (Medical): No    Lack of Transportation (Non-Medical): No  Physical Activity: Sufficiently Active (06/05/2023)   Received from [facility]   Exercise Vital Sign    Days of Exercise per Week: 7 days    Minutes of Exercise per Session: 30 min  Stress: No Stress Concern Present (06/05/2023)   Received from [facility] - Occupational Stress Questionnaire    Feeling of Stress : Not at all  Social Connections: Moderately Integrated (06/05/2023)   Received from [facility]   Social Connection and Isolation Panel [NHANES]    Frequency of Communication with Friends and Family: Three times a week    Frequency of Social Gatherings with Friends and Family: Twice a week    Attends Religious Services: 1 to 4 times per year    Active Member of [employer] or Organizations: No    Attends Engineer, structural: 1 to 4 times per year    Marital Status: Never married  Intimate Partner Violence: Not At Risk (06/05/2023)   Received from [facility]   Humiliation, Afraid, Rape, and Kick questionnaire    Fear of Current or Ex-Partner: No    Emotionally Abused: No    Physically Abused: No    Sexually Abused: No    SUBJECTIVE  Review of Systems Constitutional: Patient denies any unintentional weight loss or change in strength lntegumentary: Patient denies any rashes or pruritus Cardiovascular: Patient denies chest pain or syncope Respiratory: Patient denies shortness of breath Gastrointestinal: Patient ***denies nausea, vomiting, constipation, or diarrhea Musculoskeletal:  Patient denies muscle cramps or weakness Neurologic: Patient denies convulsions or seizures Allergic/Immunologic: Patient denies recent allergic reaction(s) Hematologic/Lymphatic: Patient denies bleeding tendencies Endocrine: Patient denies heat/cold intolerance  GU: As per HPI.  OBJECTIVE There were no vitals filed for this visit. There is no height or weight on file to calculate BMI.  Physical Examination Constitutional: No obvious distress; patient is non-toxic appearing  Cardiovascular: No visible lower extremity edema.  Respiratory: The patient does not have audible wheezing/stridor; respirations do not appear labored  Gastrointestinal: Abdomen non-distended Musculoskeletal: Normal ROM of UEs  Skin: No obvious rashes/open sores  Neurologic: CN 2-12 grossly intact Psychiatric: Answered questions appropriately with normal affect  Hematologic/Lymphatic/Immunologic: No obvious bruises or sites of spontaneous bleeding  UA:  ***positive for *** leukocytes, *** blood, ***nitrites ***Urine microscopy:  ***negative  *** WBC/hpf, *** RBC/hpf, *** bacteria ***with no evidence of UTI ***with no evidence of microscopic hematuria ***otherwise unremarkable ***glucosuria (secondary to ***Jardiance ***Farxiga use)  PVR: ***  ml  ASSESSMENT No diagnosis found.  ***We reviewed recent imaging results; ***awaiting radiology results, appears to have ***no acute findings per provider interpretation.  ***For stone prevention: Advised adequate hydration and we discussed option to consider low oxalate diet given that calcium oxalate is the most common type of stone. Handout provided about stone prevention diet.  ***For recurrent stone formers: We discussed option to proceed with 24 hour urinalysis ([facility]) for metabolic stone evaluation, which may help with targeted recommendations for dietary I medication therapies for stone prevention. Patient elected to ***proceed/ ***hold off.  Will plan  to follow up in ***6 months / ***1 year with ***KUB ***RUS for stone surveillance or sooner if needed.  Patient verbalized understanding of and agreement with current plan. All questions were answered.  PLAN Advised the following: Maintain adequate fluid intake daily. Drink citrus juice (lemon, lime or orange juice) routinely. Low oxalate diet. No follow-ups on file.  No orders of the defined types were placed in this encounter.   It has been explained that the patient is to follow regularly with their PCP in addition to all other providers involved in their care and to follow instructions provided by these respective offices. Patient advised to contact urology clinic if any urologic-pertaining questions, concerns, new symptoms or problems arise in the interim period.  There are no Patient Instructions on file for this visit.  Electronically signed by:  Donnita Falls, MSN, FNP-C, CUNP 12/09/2023 3:52 PM

## 2023-12-10 ENCOUNTER — Encounter: Payer: Self-pay | Admitting: Urology

## 2023-12-10 ENCOUNTER — Ambulatory Visit: Payer: Medicare Other | Admitting: Urology

## 2023-12-10 VITALS — BP 144/76 | HR 69

## 2023-12-10 DIAGNOSIS — Z87442 Personal history of urinary calculi: Secondary | ICD-10-CM | POA: Diagnosis not present

## 2023-12-10 DIAGNOSIS — R972 Elevated prostate specific antigen [PSA]: Secondary | ICD-10-CM | POA: Diagnosis not present

## 2023-12-10 DIAGNOSIS — Z9189 Other specified personal risk factors, not elsewhere classified: Secondary | ICD-10-CM

## 2023-12-10 DIAGNOSIS — N200 Calculus of kidney: Secondary | ICD-10-CM

## 2023-12-10 LAB — BLADDER SCAN AMB NON-IMAGING: Scan Result: 0

## 2023-12-10 NOTE — Progress Notes (Signed)
 Bladder Scan completed today.  Patient can void prior to the bladder scan. Bladder scan result: 0ml  Performed By: Guss Bunde, CMA  Additional notes-  NP to see after

## 2023-12-11 LAB — URINALYSIS, ROUTINE W REFLEX MICROSCOPIC
Bilirubin, UA: NEGATIVE
Glucose, UA: NEGATIVE
Ketones, UA: NEGATIVE
Leukocytes,UA: NEGATIVE
Nitrite, UA: NEGATIVE
Protein,UA: NEGATIVE
RBC, UA: NEGATIVE
Specific Gravity, UA: >1.03 — ABNORMAL HIGH (ref 1.005–1.030)
Urobilinogen, Ur: 1 mg/dL (ref 0.2–1.0)
pH, UA: 6 (ref 5.0–7.5)

## 2023-12-12 ENCOUNTER — Ambulatory Visit (HOSPITAL_COMMUNITY)
Admission: RE | Admit: 2023-12-12 | Discharge: 2023-12-12 | Disposition: A | Discharge status: Home / Self Care | Source: Ambulatory Visit | Attending: Urology | Admitting: Urology

## 2023-12-12 DIAGNOSIS — R972 Elevated prostate specific antigen [PSA]: Secondary | ICD-10-CM | POA: Insufficient documentation

## 2023-12-12 MED ORDER — GADOBUTROL 1 MMOL/ML IV SOLN
7.0000 mL | Freq: Once | INTRAVENOUS | Status: AC | PRN
Start: 2023-12-12 — End: 2023-12-12
  Administered 2023-12-12: 7 mL via INTRAVENOUS

## 2024-01-17 ENCOUNTER — Other Ambulatory Visit

## 2024-01-18 LAB — PSA, TOTAL AND FREE
PSA, Free Pct: 12.1 %
PSA, Free: 0.68 ng/mL
Prostate Specific Ag, Serum: 5.6 ng/mL — ABNORMAL HIGH (ref 0.0–4.0)

## 2024-01-23 ENCOUNTER — Telehealth: Payer: Self-pay

## 2024-01-23 NOTE — Telephone Encounter (Signed)
-----   Message from Lauretta Ponto sent at 01/23/2024 12:12 PM EDT ----- Please let pt know his PSA has come down slightly. Advised to follow up as planned with Dr. Joie Narrow on 01/28/24. Thanks.

## 2024-01-23 NOTE — Telephone Encounter (Signed)
 Patient's brother aware and will keep scheduled follow up.

## 2024-01-27 NOTE — Progress Notes (Signed)
 History of Present Illness: Nathaniel Huff has a h/o elevated PSA as well as urolithiasis.  He has previously been seen by Dr Inga Manges, last in mid January. At that time he was passing a Lt UVJ stone.  His past 2 PSA readings have been 6.0 and 5.6 in Feb. and April of this year. Free PSA 12%.  3.6.2025: MRI prostate FINDINGS: Prostate: Encapsulated nodularity in the transition zone compatible with benign prostatic hypertrophy. Hazy diffuse low T2 signal in the peripheral zone is nonfocal, likely postinflammatory, and is considered PI-RADS category 2. Region of interest # 1: PI-RADS category 3 lesion of the left anterior transition zone in the mid gland with encapsulated mildly heterogeneous nodularity (image 37 series 9) corresponding to reduced ADC map activity and restricted diffusion (image 14 series 7 and 8). This measures 0.32 cc (0.9 by 0.8 by 0.7 cm). Volume: 3D volumetric analysis: Prostate volume 42.7 cc (5.0 by 3.8 by 4.6 cm). Transcapsular spread: Absent Seminal vesicle involvement: Absent Neurovascular bundle involvement: Absent Pelvic adenopathy: Absent Bone metastasis: Absent Other findings: Fatty right proximal spermatic cord.   IMPRESSION: 1. PI-RADS category 3 lesion of the left anterior transition zone in the mid gland. Targeting data sent to [facility]. 2. Mild prostatomegaly and benign prostatic hypertrophy. 3. Fatty right proximal spermatic cord.  Past Medical History:  Diagnosis Date   GERD (gastroesophageal reflux disease)    Hypercholesteremia    Kidney stones    Mentally challenged    slightly    Past Surgical History:  Procedure Laterality Date   CATARACT EXTRACTION W/PHACO Left 01/05/2022   Procedure: CATARACT EXTRACTION PHACO AND INTRAOCULAR LENS PLACEMENT (IOC);  Surgeon: Tarri Farm, MD;  Location: [facility];  Service: Ophthalmology;  Laterality: Left;  ODE: 3.79   CATARACT EXTRACTION W/PHACO Right 01/15/2022   Procedure: CATARACT EXTRACTION PHACO AND  INTRAOCULAR LENS PLACEMENT (IOC);  Surgeon: Tarri Farm, MD;  Location: [facility];  Service: Ophthalmology;  Laterality: Right;  CDE: 2.72    EXTRACORPOREAL SHOCK WAVE LITHOTRIPSY Left 06/05/2022   Procedure: EXTRACORPOREAL SHOCK WAVE LITHOTRIPSY (ESWL);  Surgeon: Mellie Sprinkle., MD;  Location: [facility];  Service: Urology;  Laterality: Left;    Home Medications:  Allergies as of 01/28/2024       Reactions   Bee Venom    Pravastatin    Other reaction(s): Muscle Pain   Diphenhydramine Hcl Nausea Only   Sulfamethoxazole-trimethoprim Rash        Medication List        Accurate as of January 27, 2024  8:08 PM. If you have any questions, ask your nurse or doctor.          atorvastatin 10 MG tablet Commonly known as: LIPITOR Take 1 tablet by mouth daily.   atorvastatin 10 MG tablet Commonly known as: LIPITOR Take 10 mg by mouth daily.   cephALEXin 500 MG capsule Commonly known as: KEFLEX Take 500 mg by mouth 2 (two) times daily.   omeprazole 20 MG capsule Commonly known as: PRILOSEC Take 20 mg by mouth daily.   tamsulosin  0.4 MG Caps capsule Commonly known as: FLOMAX  Take 1 capsule (0.4 mg total) by mouth daily.        Allergies:  Allergies  Allergen Reactions   Bee Venom    Pravastatin     Other reaction(s): Muscle Pain   Diphenhydramine Hcl Nausea Only   Sulfamethoxazole-Trimethoprim Rash    No family history on file.  Social History:  reports that he has never smoked. He has never used  smokeless tobacco. He reports that he does not currently use alcohol. He reports that he does not currently use drugs.  ROS: A complete review of systems was performed.  All systems are negative except for pertinent findings as noted.  Physical Exam:  Vital signs in last 24 hours: There were no vitals taken for this visit. Constitutional:  Alert and oriented, No acute distress Cardiovascular: Regular rate  Respiratory: Normal respiratory effort GI: Abdomen is  soft, nontender, nondistended, no abdominal masses. No CVAT.  Genitourinary: Normal male phallus, testes are descended bilaterally and non-tender and without masses, scrotum is normal in appearance without lesions or masses, perineum is normal on inspection. Lymphatic: No lymphadenopathy Neurologic: Grossly intact, no focal deficits Psychiatric: Normal mood and affect  I have reviewed prior pt notes  I have reviewed urinalysis results-clear  I have independently reviewed prior imaging--both MRI images from last month and CT scan images from last year were reviewed  I have reviewed prior PSA results     Impression/Assessment:  -History of urolithiasis, no active symptoms  -Elevated PSA with 1 region of interest on MRI-PI-RADS category 3  Plan:  -I have recommended fusion biopsy with Dr. Inga Manges in [place] will work on setting that up

## 2024-01-28 ENCOUNTER — Ambulatory Visit (INDEPENDENT_AMBULATORY_CARE_PROVIDER_SITE_OTHER): Admitting: Urology

## 2024-01-28 VITALS — BP 130/83 | HR 71

## 2024-01-28 DIAGNOSIS — Z87442 Personal history of urinary calculi: Secondary | ICD-10-CM | POA: Diagnosis not present

## 2024-01-28 DIAGNOSIS — R972 Elevated prostate specific antigen [PSA]: Secondary | ICD-10-CM

## 2024-01-28 DIAGNOSIS — N200 Calculus of kidney: Secondary | ICD-10-CM

## 2024-01-28 DIAGNOSIS — N201 Calculus of ureter: Secondary | ICD-10-CM

## 2024-01-28 LAB — URINALYSIS, ROUTINE W REFLEX MICROSCOPIC
Bilirubin, UA: NEGATIVE
Glucose, UA: NEGATIVE
Ketones, UA: NEGATIVE
Leukocytes,UA: NEGATIVE
Nitrite, UA: NEGATIVE
Protein,UA: NEGATIVE
RBC, UA: NEGATIVE
Specific Gravity, UA: <1.005 — ABNORMAL LOW (ref 1.005–1.030)
Urobilinogen, Ur: 0.2 mg/dL (ref 0.2–1.0)
pH, UA: 6 (ref 5.0–7.5)

## 2024-02-06 NOTE — Progress Notes (Signed)
 Created in error

## 2024-02-11 ENCOUNTER — Telehealth: Payer: Self-pay

## 2024-02-11 DIAGNOSIS — R972 Elevated prostate specific antigen [PSA]: Secondary | ICD-10-CM

## 2024-02-11 NOTE — Telephone Encounter (Signed)
 [facility] cancelled fusion biopsy due to insurance not able to cover it and pt cannot cover the cost either how would you like to follow up ?

## 2024-02-12 NOTE — Telephone Encounter (Signed)
 Called pt brother to let him know [facility] had to cancel pt fusion biopsy due to insurance covered and also made him aware that MD wants to follow up with an biopsy done at [facility] I gave pt brother time and date and told him I will mail details to listed address pt brother voiced his understanding

## 2024-02-12 NOTE — Addendum Note (Signed)
 Addended by: Dyke Glasser on: 02/12/2024 08:41 AM   Modules accepted: Orders

## 2024-03-22 NOTE — Procedures (Signed)
 Nathaniel Huff is here for TRU/Bx. He has an elevated PSA as well as a PIRADS 3 lesion (0.32 mL lesion in Lt anterior transition zone of mid gland). Prostate volume was 43 mL. Systematic biopsies done as pt's insurance will not cover fusion technology.  Risks, benefits, and some of the potential complications of a transrectal ultrasounds of the prostate (TRUSP) with biopsies were discussed at length with the patient including gross hematuria, blood in the bowel movements, hematospermia, bacteremia, infection, voiding discomfort, urinary retention, fever, chills, sepsis, blood transfusion, death, and others. All questions were answered. Informed consent was obtained. The patient confirmed that he had taken his pre-procedure antibiotic. All anticoagulants were discontinued prior to the procedure. The patient emptied his bladder. He was positioned in a comfortable left lateral decubitus position with hips and knees acutely flexed.  The rectal probe was inserted into the rectum without difficulty. 10cc of 2% Lidocaine  without epinephrine  was instilled with a spinal needle using ultrasound guidance near the junction of each seminal vesicle and the prostate.  Sequential transverse (axial) scans were made in small increments beginning at the seminal vesicles and ending at the prostatic apex. Sequential longitudinal (saggital) scans were made in small increments beginning at the right lateral prostate and ending at the left lateral prostate. Excellent anatomical imaging was obtained. The peripheral, transitional, and central zones were well-defined. The seminal vesicles were normal.  Prostate volume 50 ml.  There were no hypoechoic areas. 12 needle core biopsies were performed. 1 biopsy each was taken from the following areas:  Right lateral base, right medial base, right lateral mid prostate, right medial mid prostate, right lateral apical prostate, right medial apical prostate, left lateral base, left medial base, left  lateral mid prostate, left medial mid prostate, left lateral apical prostate, left medial apical prostate.. Minimal prostatic calcifications were noted. Excellent biopsy specimens were obtained.  Follow-up rectal examination was unremarkable.  He did experience what I thought were side effects from the lidocaine -short period of decreased response, hypertension.  His oxygen saturations were 100%.  After 2 minutes of slight decreased responses, he returned to normal mentation and responded to all questions appropriately.   Antibiotic instructions were given. The patient was told that:  For several days:  he should increase his fluid intake and limit strenuous activity  he might have mild discomfort at the base of his penis or in his rectum  he might have blood in his urine or blood in his bowel movements  For 2-3 months:  he might have blood in his ejaculate (semen)  Instructions were given to call the office immedicately for blood clots in the urine or bowel movements, difficulty urinating, inability to urinate, urinary retention, painful or frequent urination, fever, chills, nausea, vomiting, or other illness. The patient stated that he understood these instructions and would comply with them. We told the patient that prostate biopsy pathology reports are usually available within 3-5 working days, unless a pathologic second opinion is required, which may take 7-14 days. We told him to contact us  to check on the status of his biopsy if he has not heard from us  within 7 days. The patient left the ultrasound examination room in stable condition.

## 2024-03-24 ENCOUNTER — Encounter (HOSPITAL_COMMUNITY): Payer: Self-pay

## 2024-03-24 ENCOUNTER — Ambulatory Visit (HOSPITAL_COMMUNITY)
Admission: RE | Admit: 2024-03-24 | Discharge: 2024-03-24 | Disposition: A | Discharge status: Home / Self Care | Source: Ambulatory Visit | Attending: Urology | Admitting: Urology

## 2024-03-24 ENCOUNTER — Ambulatory Visit: Admitting: Urology

## 2024-03-24 DIAGNOSIS — R972 Elevated prostate specific antigen [PSA]: Secondary | ICD-10-CM | POA: Diagnosis present

## 2024-03-24 MED ORDER — LIDOCAINE HCL (PF) 2 % IJ SOLN
10.0000 mL | Freq: Once | INTRAMUSCULAR | Status: AC
  Administered 2024-03-24: 10 mL

## 2024-03-24 MED ORDER — LIDOCAINE HCL (PF) 1 % IJ SOLN
2.1000 mL | Freq: Once | INTRAMUSCULAR | Status: AC
  Administered 2024-03-24: 2.1 mL

## 2024-03-24 MED ORDER — CEFTRIAXONE SODIUM 1 G IJ SOLR
INTRAMUSCULAR | Status: AC
  Filled 2024-03-24: qty 10

## 2024-03-24 MED ORDER — CEFTRIAXONE SODIUM 1 G IJ SOLR
1.0000 g | Freq: Once | INTRAMUSCULAR | Status: AC
  Administered 2024-03-24: 1 g via INTRAMUSCULAR

## 2024-03-24 MED ORDER — LIDOCAINE HCL (PF) 1 % IJ SOLN
INTRAMUSCULAR | Status: AC
  Filled 2024-03-24: qty 5

## 2024-03-24 NOTE — Progress Notes (Signed)
 Prostate Biopsy complete today with IM injection of antibiotic given per verbal orders from MD. Around 1610 patient experienced a short period of decreased responsiveness and had hypertension. His oxygen saturations were 100%. After 2-3 minutes of slight decreased responsiveness, he returned to normal mentation and responded to all questions appropriately. Patient held post procedure until vital signs returned to within normal limits. Patient's brother in waiting room updated by MD. Patient verbalized understanding of discharge instructions and ambulatory at departure with brother with no acute distress noted. Specimens taken to lab by Richard from ultrasound for processing.

## 2024-04-09 ENCOUNTER — Telehealth: Payer: Self-pay

## 2024-04-09 DIAGNOSIS — R972 Elevated prostate specific antigen [PSA]: Secondary | ICD-10-CM

## 2024-04-09 NOTE — Telephone Encounter (Signed)
 Pt brother called and lvm  to inform us  that pt has yet to receive bx results from 06/17

## 2024-04-09 NOTE — Telephone Encounter (Signed)
 Called pt  brother to notify him a messsage has been sent to provider but the results are not in yet as soon as we receive them they will be notified

## 2024-04-15 ENCOUNTER — Telehealth: Payer: Self-pay

## 2024-04-15 ENCOUNTER — Encounter: Payer: Self-pay | Admitting: Urology

## 2024-04-15 NOTE — Addendum Note (Signed)
 Addended by: SAMMIE EXIE HERO on: 04/15/2024 01:31 PM   Modules accepted: Orders

## 2024-04-15 NOTE — Telephone Encounter (Signed)
 Called pt brother to give bx results per MD Dahlstedt, Good news, all the biopsies were benign. I did like to see him back in about 6 months following PSA. Pt brother voiced his understanding

## 2024-04-15 NOTE — Telephone Encounter (Signed)
 Got the results from pathology please review, scanned into pt chart

## 2024-04-15 NOTE — Telephone Encounter (Signed)
 Called [facility] and Nathaniel Huff confirmed pt name and DOB and asked what office we were coming from [place] made aware of of office and stated they have the report and would fax over right now.

## 2024-10-13 ENCOUNTER — Other Ambulatory Visit

## 2024-10-16 ENCOUNTER — Other Ambulatory Visit

## 2024-10-16 DIAGNOSIS — R972 Elevated prostate specific antigen [PSA]: Secondary | ICD-10-CM

## 2024-10-17 LAB — PSA: Prostate Specific Ag, Serum: 6.2 ng/mL — ABNORMAL HIGH (ref 0.0–4.0)

## 2024-10-20 ENCOUNTER — Ambulatory Visit: Admitting: Urology

## 2024-10-20 VITALS — BP 130/79 | HR 65

## 2024-10-20 DIAGNOSIS — N400 Enlarged prostate without lower urinary tract symptoms: Secondary | ICD-10-CM | POA: Diagnosis not present

## 2024-10-20 DIAGNOSIS — R972 Elevated prostate specific antigen [PSA]: Secondary | ICD-10-CM

## 2024-10-20 DIAGNOSIS — Z87442 Personal history of urinary calculi: Secondary | ICD-10-CM | POA: Diagnosis not present

## 2024-10-20 DIAGNOSIS — N200 Calculus of kidney: Secondary | ICD-10-CM

## 2024-10-20 LAB — URINALYSIS, ROUTINE W REFLEX MICROSCOPIC
Bilirubin, UA: NEGATIVE
Glucose, UA: NEGATIVE
Ketones, UA: NEGATIVE
Leukocytes,UA: NEGATIVE
Nitrite, UA: NEGATIVE
Protein,UA: NEGATIVE
Specific Gravity, UA: 1.03 (ref 1.005–1.030)
Urobilinogen, Ur: 1 mg/dL (ref 0.2–1.0)
pH, UA: 6 (ref 5.0–7.5)

## 2024-10-20 LAB — MICROSCOPIC EXAMINATION
Bacteria, UA: NONE SEEN
WBC, UA: NONE SEEN /HPF (ref 0–5)

## 2024-10-20 NOTE — Progress Notes (Signed)
 "   Impression/Assessment:  -History of urolithiasis, no active symptoms  -Elevated PSA with negative ultrasound and biopsy in June of this year  - BPH by size criteria without significant lower urinary tract symptoms  Plan:  - PSA is recheck today  - Follow-up and come back in a year to see Dr. Sherrilee.  History of Present Illness: Nathaniel Nathaniel Huff a h/o elevated PSA as well as urolithiasis.  He Nathaniel Huff previously been seen by Dr Watt, last in mid January. At that time he was passing a Lt UVJ stone.  His past 2 PSA readings have been 6.0 and 5.6 in Feb. and April of this year. Free PSA 12%.  3.6.2025: MRI prostate FINDINGS: Prostate: Encapsulated nodularity in the transition zone compatible with benign prostatic hypertrophy. Hazy diffuse low T2 signal in the peripheral zone is nonfocal, likely postinflammatory, and is considered PI-RADS category 2. Region of interest # 1: PI-RADS category 3 lesion of the left anterior transition zone in the mid gland with encapsulated mildly heterogeneous nodularity (image 37 series 9) corresponding to reduced ADC map activity and restricted diffusion (image 14 series 7 and 8). This measures 0.32 cc (0.9 by 0.8 by 0.7 cm). Volume: 3D volumetric analysis: Prostate volume 42.7 cc (5.0 by 3.8 by 4.6 cm). Transcapsular spread: Absent Seminal vesicle involvement: Absent Neurovascular bundle involvement: Absent Pelvic adenopathy: Absent Bone metastasis: Absent Other findings: Fatty right proximal spermatic cord.   IMPRESSION: 1. PI-RADS category 3 lesion of the left anterior transition zone in the mid gland. Targeting data sent to [facility]. 2. Mild prostatomegaly and benign prostatic hypertrophy. 3. Fatty right proximal spermatic cord.  6.17.2025: Ultrasound and biopsy.  Prostate volume 50 mL.  PSA 6.2, PSA density 0.12.  All 12 cores benign. IPSS 2/2  Past Medical History:  Diagnosis Date   GERD (gastroesophageal reflux disease)     Hypercholesteremia    Kidney stones    Mentally challenged    slightly    Past Surgical History:  Procedure Laterality Date   CATARACT EXTRACTION W/PHACO Left 01/05/2022   Procedure: CATARACT EXTRACTION PHACO AND INTRAOCULAR LENS PLACEMENT (IOC);  Surgeon: Harrie Agent, MD;  Location: [facility];  Service: Ophthalmology;  Laterality: Left;  ODE: 3.79   CATARACT EXTRACTION W/PHACO Right 01/15/2022   Procedure: CATARACT EXTRACTION PHACO AND INTRAOCULAR LENS PLACEMENT (IOC);  Surgeon: Harrie Agent, MD;  Location: [facility];  Service: Ophthalmology;  Laterality: Right;  CDE: 2.72    EXTRACORPOREAL SHOCK WAVE LITHOTRIPSY Left 06/05/2022   Procedure: EXTRACORPOREAL SHOCK WAVE LITHOTRIPSY (ESWL);  Surgeon: Roseann Adine PARAS., MD;  Location: [facility];  Service: Urology;  Laterality: Left;    Home Medications:  Allergies as of 10/20/2024       Reactions   Bee Venom    Pravastatin    Other reaction(s): Muscle Pain   Diphenhydramine Hcl Nausea Only   Sulfamethoxazole-trimethoprim Rash        Medication List        Accurate as of October 20, 2024  9:26 AM. If you have any questions, ask your nurse or doctor.          atorvastatin 10 MG tablet Commonly known as: LIPITOR Take 1 tablet by mouth daily.   atorvastatin 10 MG tablet Commonly known as: LIPITOR Take 10 mg by mouth daily.   cephALEXin 500 MG capsule Commonly known as: KEFLEX Take 500 mg by mouth 2 (two) times daily.   omeprazole 20 MG capsule Commonly known as: PRILOSEC Take 20 mg by mouth daily.  tamsulosin  0.4 MG Caps capsule Commonly known as: FLOMAX  Take 1 capsule (0.4 mg total) by mouth daily.        Allergies:  Allergies  Allergen Reactions   Bee Venom    Pravastatin     Other reaction(s): Muscle Pain   Diphenhydramine Hcl Nausea Only   Sulfamethoxazole-Trimethoprim Rash    No family history on file.  Social History:  reports that he Nathaniel Huff never smoked. He Nathaniel Huff never used smokeless tobacco. He  reports that he does not currently use alcohol. He reports that he does not currently use drugs.    I have reviewed prior pt notes  I have reviewed urinalysis results-clear  I have independently reviewed prior imaging--MRI and CT results reviewed  I have reviewed prior PSA as well as pathology results       "

## 2024-10-21 ENCOUNTER — Ambulatory Visit: Payer: Self-pay

## 2024-10-21 LAB — PSA: Prostate Specific Ag, Serum: 6.6 ng/mL — ABNORMAL HIGH (ref 0.0–4.0)

## 2024-10-21 NOTE — Telephone Encounter (Signed)
-----   Message from Garnette Shack, MD sent at 10/21/2024 10:20 AM EST ----- Please call patient's brother-PSA fairly stable at 6.6.  Keep scheduled follow-up.

## 2024-10-21 NOTE — Telephone Encounter (Signed)
 Tried calling pt's family member with no answer due to phone disconnect.

## 2025-10-25 ENCOUNTER — Ambulatory Visit: Admitting: Urology
